# Patient Record
Sex: Female | Born: 1935 | Race: White | Smoking: Never smoker
Health system: Southern US, Community
[De-identification: ages and names within clinical notes are randomized; demographics above are authoritative.]

## PROBLEM LIST (undated history)

## (undated) DIAGNOSIS — Z8669 Personal history of other diseases of the nervous system and sense organs: Secondary | ICD-10-CM

## (undated) DIAGNOSIS — J449 Chronic obstructive pulmonary disease, unspecified: Secondary | ICD-10-CM

## (undated) DIAGNOSIS — I351 Nonrheumatic aortic (valve) insufficiency: Secondary | ICD-10-CM

## (undated) DIAGNOSIS — I340 Nonrheumatic mitral (valve) insufficiency: Secondary | ICD-10-CM

## (undated) DIAGNOSIS — J309 Allergic rhinitis, unspecified: Secondary | ICD-10-CM

## (undated) DIAGNOSIS — I071 Rheumatic tricuspid insufficiency: Secondary | ICD-10-CM

## (undated) DIAGNOSIS — I471 Supraventricular tachycardia: Secondary | ICD-10-CM

## (undated) DIAGNOSIS — I739 Peripheral vascular disease, unspecified: Secondary | ICD-10-CM

## (undated) DIAGNOSIS — I4719 Other supraventricular tachycardia: Secondary | ICD-10-CM

## (undated) DIAGNOSIS — I493 Ventricular premature depolarization: Secondary | ICD-10-CM

## (undated) DIAGNOSIS — I272 Pulmonary hypertension, unspecified: Secondary | ICD-10-CM

## (undated) DIAGNOSIS — H3530 Unspecified macular degeneration: Secondary | ICD-10-CM

## (undated) DIAGNOSIS — I779 Disorder of arteries and arterioles, unspecified: Secondary | ICD-10-CM

## (undated) DIAGNOSIS — I491 Atrial premature depolarization: Secondary | ICD-10-CM

## (undated) DIAGNOSIS — J9610 Chronic respiratory failure, unspecified whether with hypoxia or hypercapnia: Secondary | ICD-10-CM

## (undated) DIAGNOSIS — R002 Palpitations: Secondary | ICD-10-CM

## (undated) HISTORY — PX: APPENDECTOMY: SHX54

## (undated) HISTORY — DX: Chronic obstructive pulmonary disease, unspecified: J44.9

## (undated) HISTORY — DX: Unspecified macular degeneration: H35.30

## (undated) HISTORY — DX: Allergic rhinitis, unspecified: J30.9

## (undated) HISTORY — PX: CATARACT EXTRACTION: SUR2

## (undated) HISTORY — DX: Personal history of other diseases of the nervous system and sense organs: Z86.69

## (undated) HISTORY — DX: Peripheral vascular disease, unspecified: I73.9

## (undated) HISTORY — DX: Disorder of arteries and arterioles, unspecified: I77.9

## (undated) HISTORY — DX: Palpitations: R00.2

---

## 2010-11-13 ENCOUNTER — Ambulatory Visit: Payer: Self-pay | Admitting: Cardiovascular Disease

## 2010-11-13 ENCOUNTER — Encounter: Payer: Self-pay | Admitting: Cardiovascular Disease

## 2010-11-13 ENCOUNTER — Ambulatory Visit (INDEPENDENT_AMBULATORY_CARE_PROVIDER_SITE_OTHER): Payer: Medicare Other | Admitting: Cardiovascular Disease

## 2010-11-13 DIAGNOSIS — I272 Pulmonary hypertension, unspecified: Secondary | ICD-10-CM

## 2010-11-13 DIAGNOSIS — R079 Chest pain, unspecified: Secondary | ICD-10-CM | POA: Insufficient documentation

## 2010-11-13 DIAGNOSIS — I2789 Other specified pulmonary heart diseases: Secondary | ICD-10-CM

## 2010-11-13 DIAGNOSIS — I38 Endocarditis, valve unspecified: Secondary | ICD-10-CM

## 2010-11-13 DIAGNOSIS — R0609 Other forms of dyspnea: Secondary | ICD-10-CM

## 2010-11-13 NOTE — Progress Notes (Signed)
HPI: Patient is an 77 year old female, with no prior history of heart disease, now referred, from Dr. Sherril Croon, for cardiac evaluation of CP and DOE.  Patient has never undergone prior formal cardiac evaluation, or diagnostic testing. She had a recent transthoracic echocardiogram, per Dr. Sherril Croon, indicating normal LVF (EF 55-60%), with mild MR, mild/moderate AR, moderate TR, and mild/moderate pulmonary hypertension (RVSP 40-50 mmHg).  Patient presents with cardiac risk factors notable only for age and family history. She denies history of HTN, DM, or HLD. She has never smoked tobacco.  She presents with complaint of recent episodic DOE, only when walking uphill, and which she attributed to warmer temperatures. She denies any associated exertional CP, but did have a few isolated episodes of brief CP, when laying in bed at night. These symptoms were not associated with any radiation to the jaw or upper extremities, or with any associated diaphoresis, dyspnea, or nausea. She also reports one singular episode of right jaw pain, several months ago, with no strict correlation to exertion.  With the cooling of outdoor temperatures, the patient states that her DOE has completely resolved.  A recent 12-lead electrocardiogram, per Dr. Sherril Croon, indicated NSR at 76 bpm; LAD; LVH by voltage criteria; and no definite ischemic changes.  No Known Allergies  No current outpatient prescriptions on file prior to visit.    Past Medical History  Diagnosis Date  . Other chest pain   . Shortness of breath   . Other malaise and fatigue   . Pure hypercholesterolemia   . Benign neoplasm of colon   . Allergic rhinitis, cause unspecified   . Dizziness and giddiness   . Unspecified glaucoma   . Other abnormal blood chemistry   . Unspecified vitamin D deficiency   . Palpitations     Past Surgical History  Procedure Date  . Appendectomy   . Cataract extraction     Bilateral    History   Social History  . Marital  Status: Widowed    Spouse Name: N/A    Number of Children: N/A  . Years of Education: N/A   Occupational History  . RETIRED    Social History Main Topics  . Smoking status: Never Smoker   . Smokeless tobacco: Not on file  . Alcohol Use: No  . Drug Use: No  . Sexually Active: Not on file   Other Topics Concern  . Not on file   Social History Narrative  . No narrative on file    Family History  Problem Relation Age of Onset  . Heart failure Father   . Heart failure Sister   . Heart failure Mother   . Stroke Sister   . Cancer Sister   . Alzheimer's disease Brother     ROS: Negative for exertional chest pain, DOE, orthopnea, PND, lower extremity edema, palpitations, presyncope/syncope, claudication, reflux, hematuria, hematochezia, or melena. Remaining systems reviewed, and are negative.   PHYSICAL EXAM:  BP 144/78  Pulse 64  Ht 5' (1.524 m)  Wt 117 lb (53.071 kg)  BMI 22.85 kg/m2  GENERAL: 77 year old female, NAD HEENT: NCAT, PERRLA, EOMI; sclera clear; no xanthelasma NECK: palpable bilateral carotid pulses, no bruits; no JVD; no TM LUNGS: CTA bilaterally CARDIAC: RRR (S1, S2); soft, 2/6 short systolic ejection murmur at the base; no significant diastolic blow; no rubs or gallops ABDOMEN: soft, non-tender; intact BS EXTREMETIES: palpable bilateral   Femoral pulses, without bruits; intact distal pulses; no significant peripheral edema SKIN: warm/dry; no obvious rash/lesions MUSCULOSKELETAL: no  joint deformity NEURO: no focal deficit; NL affect   EKG:  Refer to EKG section    ASSESSMENT & PLAN:

## 2010-11-13 NOTE — Assessment & Plan Note (Addendum)
Mild/moderate by recent transthoracic echocardiogram, per Dr. Sherril Croon, with RVSP 40-50 mmHg. Most likely related to diastolic dysfunction.

## 2010-11-13 NOTE — Assessment & Plan Note (Signed)
Mild MR and mild/moderate aortic regurgitation, by recent transthoracic echocardiogram.

## 2010-11-13 NOTE — Assessment & Plan Note (Addendum)
Recommendation is to further assess with a dobutamine stress echocardiogram, for risk stratification. If negative, then no further workup would be indicated. However, the patient has declined to proceed. Given that her DOE symptoms have since resolved, attributed to cooler weather, she feels that she no longer needs a stress test. Therefore, we will not pursue any cardiac testing at this time, and recommend that she f/u with Dr Sherril Croon, as scheduled. She is to return to Dr Kirke Corin, on an as needed basis.  Patient was seen and examined in conjunction with Dr. Kirke Corin.

## 2010-11-13 NOTE — Assessment & Plan Note (Addendum)
As outlined, patient currently declines to proceed with recommended cardiac testing.

## 2010-11-13 NOTE — Patient Instructions (Signed)
   Follow up as needed. Your physician recommends that you continue on your current medications as directed. Please refer to the Current Medication list given to you today. 

## 2011-02-06 DIAGNOSIS — R079 Chest pain, unspecified: Secondary | ICD-10-CM

## 2011-02-18 DIAGNOSIS — R072 Precordial pain: Secondary | ICD-10-CM

## 2011-02-19 ENCOUNTER — Encounter: Payer: Self-pay | Admitting: Cardiovascular Disease

## 2011-02-23 ENCOUNTER — Encounter: Payer: Self-pay | Admitting: *Deleted

## 2011-02-26 ENCOUNTER — Encounter: Payer: Self-pay | Admitting: Cardiovascular Disease

## 2011-02-26 ENCOUNTER — Ambulatory Visit (INDEPENDENT_AMBULATORY_CARE_PROVIDER_SITE_OTHER): Payer: Medicare Other | Admitting: Cardiovascular Disease

## 2011-02-26 DIAGNOSIS — I272 Pulmonary hypertension, unspecified: Secondary | ICD-10-CM

## 2011-02-26 DIAGNOSIS — I38 Endocarditis, valve unspecified: Secondary | ICD-10-CM

## 2011-02-26 DIAGNOSIS — R079 Chest pain, unspecified: Secondary | ICD-10-CM

## 2011-02-26 DIAGNOSIS — I2789 Other specified pulmonary heart diseases: Secondary | ICD-10-CM

## 2011-02-26 NOTE — Assessment & Plan Note (Signed)
Mild/moderate by recent transthoracic echocardiogram, with RVSP 40-50 mmHg. Most likely related to diastolic dysfunction.

## 2011-02-26 NOTE — Assessment & Plan Note (Signed)
Mild MR and mild/moderate aortic regurgitation, by transthoracic echocardiogram. This can be monitored clinically with consideration for repeat echocardiogram in 2 years.

## 2011-02-26 NOTE — Assessment & Plan Note (Signed)
Patient had atypical chest pain which has resolved. Her nuclear stress test was overall normal. No further cardiac testing is indicated. The patient was reassured. She can followup with [place] as needed.

## 2011-02-26 NOTE — Patient Instructions (Signed)
   Follow up as needed. Your physician recommends that you continue on your current medications as directed. Please refer to the Current Medication list given to you today. 

## 2011-02-26 NOTE — Progress Notes (Signed)
HPI  This is an 78 year old female who is here today for a followup visit. She was seen a few months ago for atypical chest pain. She declined a stress test at that time. She presented recently to Dr. Bonnita Levan office with atypical chest pain and left arm discomfort. She was hospitalized. She ruled out for myocardial infarction. She was discharged home and scheduled for an outpatient Lexiscan nuclear stress test. This was performed and showed no evidence of ischemia or infarcts with normal ejection fraction. The patient has not had any recurrent symptoms of chest or arm discomfort. She is physically active and goes to the [facility] 2 times a week for exercise without significant limitations.  No Known Allergies   Current Outpatient Prescriptions on File Prior to Visit  Medication Sig Dispense Refill  . aspirin 81 MG tablet Take 81 mg by mouth daily.        . Calcium-Vitamin D 600-125 MG-UNIT TABS Take by mouth daily.        . Cholecalciferol (VITAMIN D3) 1000 UNITS CAPS Take by mouth daily.        . fish oil-omega-3 fatty acids 1000 MG capsule Take 1 g by mouth daily.        . timolol (BETIMOL) 0.25 % ophthalmic solution Place 1 drop into both eyes 2 (two) times daily.           Past Medical History  Diagnosis Date  . Other chest pain   . Shortness of breath   . Other malaise and fatigue   . Pure hypercholesterolemia   . Benign neoplasm of colon   . Allergic rhinitis, cause unspecified   . Dizziness and giddiness   . Unspecified glaucoma   . Other abnormal blood chemistry   . Unspecified vitamin D deficiency   . Palpitations      Past Surgical History  Procedure Date  . Appendectomy   . Cataract extraction     Bilateral     Family History  Problem Relation Age of Onset  . Heart failure Father   . Heart failure Sister   . Heart failure Mother   . Stroke Sister   . Cancer Sister   . Alzheimer's disease Brother      History   Social History  . Marital Status: Widowed   Spouse Name: N/A    Number of Children: N/A  . Years of Education: N/A   Occupational History  . RETIRED    Social History Main Topics  . Smoking status: Never Smoker   . Smokeless tobacco: Never Used  . Alcohol Use: No  . Drug Use: No  . Sexually Active: Not on file   Other Topics Concern  . Not on file   Social History Narrative  . No narrative on file     PHYSICAL EXAM   BP 158/87  Pulse 73  Ht 5' (1.524 m)  Wt 114 lb (51.71 kg)  BMI 22.26 kg/m2  Constitutional: She is oriented to person, place, and time. She appears well-developed and well-nourished. No distress.  HENT: No nasal discharge.  Head: Normocephalic and atraumatic.  Eyes: Pupils are equal, round, and reactive to light. Right eye exhibits no discharge. Left eye exhibits no discharge.  Neck: Normal range of motion. Neck supple. No JVD present. No thyromegaly present.  Cardiovascular: Normal rate, regular rhythm, normal heart sounds. Exam reveals no gallop and no friction rub.  There is a 3/6 systolic ejection murmur at the aortic area which is early peaking. Pulmonary/Chest:  Effort normal and breath sounds normal. No stridor. No respiratory distress. She has no wheezes. She has no rales. She exhibits no tenderness.  Abdominal: Soft. Bowel sounds are normal. She exhibits no distension. There is no tenderness. There is no rebound and no guarding.  Musculoskeletal: Normal range of motion. She exhibits no edema and no tenderness.  Neurological: She is alert and oriented to person, place, and time. Coordination normal.  Skin: Skin is warm and dry. No rash noted. She is not diaphoretic. No erythema. No pallor.  Psychiatric: She has a normal mood and affect. Her behavior is normal. Judgment and thought content normal.      ASSESSMENT AND PLAN

## 2014-06-02 ENCOUNTER — Emergency Department (HOSPITAL_COMMUNITY): Payer: Medicare Other

## 2014-06-02 ENCOUNTER — Encounter (HOSPITAL_COMMUNITY): Payer: Self-pay

## 2014-06-02 ENCOUNTER — Emergency Department (HOSPITAL_COMMUNITY)
Admission: EM | Admit: 2014-06-02 | Discharge: 2014-06-02 | Disposition: A | Discharge status: Home / Self Care | Payer: Medicare Other | Attending: Emergency Medicine | Admitting: Emergency Medicine

## 2014-06-02 DIAGNOSIS — Z7982 Long term (current) use of aspirin: Secondary | ICD-10-CM | POA: Diagnosis not present

## 2014-06-02 DIAGNOSIS — Z8709 Personal history of other diseases of the respiratory system: Secondary | ICD-10-CM | POA: Insufficient documentation

## 2014-06-02 DIAGNOSIS — E559 Vitamin D deficiency, unspecified: Secondary | ICD-10-CM | POA: Diagnosis not present

## 2014-06-02 DIAGNOSIS — R0602 Shortness of breath: Secondary | ICD-10-CM | POA: Insufficient documentation

## 2014-06-02 DIAGNOSIS — R0989 Other specified symptoms and signs involving the circulatory and respiratory systems: Secondary | ICD-10-CM | POA: Insufficient documentation

## 2014-06-02 DIAGNOSIS — E780 Pure hypercholesterolemia: Secondary | ICD-10-CM | POA: Diagnosis not present

## 2014-06-02 DIAGNOSIS — R079 Chest pain, unspecified: Secondary | ICD-10-CM | POA: Insufficient documentation

## 2014-06-02 DIAGNOSIS — H409 Unspecified glaucoma: Secondary | ICD-10-CM | POA: Diagnosis not present

## 2014-06-02 DIAGNOSIS — Z86018 Personal history of other benign neoplasm: Secondary | ICD-10-CM | POA: Insufficient documentation

## 2014-06-02 DIAGNOSIS — K5900 Constipation, unspecified: Secondary | ICD-10-CM | POA: Insufficient documentation

## 2014-06-02 DIAGNOSIS — Z79899 Other long term (current) drug therapy: Secondary | ICD-10-CM | POA: Insufficient documentation

## 2014-06-02 DIAGNOSIS — M545 Low back pain: Secondary | ICD-10-CM | POA: Insufficient documentation

## 2014-06-02 LAB — CBC WITH DIFFERENTIAL/PLATELET
BASOS ABS: 0.1 10*3/uL (ref 0.0–0.1)
BASOS PCT: 1 % (ref 0–1)
Eosinophils Absolute: 0.6 10*3/uL (ref 0.0–0.7)
Eosinophils Relative: 7 % — ABNORMAL HIGH (ref 0–5)
HEMATOCRIT: 42.8 % (ref 36.0–46.0)
Hemoglobin: 13.6 g/dL (ref 12.0–15.0)
Lymphocytes Relative: 22 % (ref 12–46)
Lymphs Abs: 1.8 10*3/uL (ref 0.7–4.0)
MCH: 29.3 pg (ref 26.0–34.0)
MCHC: 31.8 g/dL (ref 30.0–36.0)
MCV: 92.2 fL (ref 78.0–100.0)
MONO ABS: 0.7 10*3/uL (ref 0.1–1.0)
MONOS PCT: 9 % (ref 3–12)
Neutro Abs: 4.8 10*3/uL (ref 1.7–7.7)
Neutrophils Relative %: 61 % (ref 43–77)
Platelets: 241 10*3/uL (ref 150–400)
RBC: 4.64 MIL/uL (ref 3.87–5.11)
RDW: 13 % (ref 11.5–15.5)
WBC: 8 10*3/uL (ref 4.0–10.5)

## 2014-06-02 LAB — URINALYSIS, ROUTINE W REFLEX MICROSCOPIC
Bilirubin Urine: NEGATIVE
Glucose, UA: NEGATIVE mg/dL
HGB URINE DIPSTICK: NEGATIVE
Ketones, ur: NEGATIVE mg/dL
Nitrite: NEGATIVE
PH: 5.5 (ref 5.0–8.0)
Protein, ur: NEGATIVE mg/dL
Specific Gravity, Urine: 1.02 (ref 1.005–1.030)
UROBILINOGEN UA: 0.2 mg/dL (ref 0.0–1.0)

## 2014-06-02 LAB — URINE MICROSCOPIC-ADD ON

## 2014-06-02 LAB — COMPREHENSIVE METABOLIC PANEL
ALK PHOS: 49 U/L (ref 39–117)
ALT: 12 U/L (ref 0–35)
AST: 19 U/L (ref 0–37)
Albumin: 3.9 g/dL (ref 3.5–5.2)
Anion gap: 8 (ref 5–15)
BILIRUBIN TOTAL: 0.8 mg/dL (ref 0.3–1.2)
BUN: 13 mg/dL (ref 6–23)
CO2: 32 mmol/L (ref 19–32)
Calcium: 9.2 mg/dL (ref 8.4–10.5)
Chloride: 102 mmol/L (ref 96–112)
Creatinine, Ser: 0.45 mg/dL — ABNORMAL LOW (ref 0.50–1.10)
GFR calc Af Amer: >90 mL/min (ref >90)
GFR calc non Af Amer: 88 mL/min — ABNORMAL LOW (ref >90)
Glucose, Bld: 133 mg/dL — ABNORMAL HIGH (ref 70–99)
POTASSIUM: 3.8 mmol/L (ref 3.5–5.1)
Sodium: 142 mmol/L (ref 135–145)
Total Protein: 7.3 g/dL (ref 6.0–8.3)

## 2014-06-02 LAB — TROPONIN I

## 2014-06-02 LAB — BRAIN NATRIURETIC PEPTIDE: B Natriuretic Peptide: 93 pg/mL (ref 0.0–100.0)

## 2014-06-02 MED ORDER — POLYETHYLENE GLYCOL 3350 17 G PO PACK: 17.0000 g | PACK | Freq: Every day | ORAL | Status: DC

## 2014-06-02 MED ORDER — DICYCLOMINE HCL 20 MG PO TABS
20.0000 mg | ORAL_TABLET | Freq: Two times a day (BID) | ORAL | Status: DC | PRN
Start: 2014-06-02 — End: 2015-06-06

## 2014-06-02 NOTE — ED Provider Notes (Signed)
CSN: [id number]     Arrival date & time 06/02/14  0321 History   First MD Initiated Contact with Patient 06/02/14 0327     Chief Complaint  Patient presents with  . Back Pain     (Consider location/radiation/quality/duration/timing/severity/associated sxs/prior Treatment) HPI Patient presents with several complaints. For the past 2 nights she's had chest discomfort when lying down. She states she's had no chest discomfort this evening. In fact currently she is asymptomatic. What prompted her visit tonight was low back pain that radiated into the abdomen. This was worse when lying flat and better when she stood up. Currently she has no pain. She denies any urinary symptoms. Normal bowel movement was yesterday. No focal weakness or numbness. Patient has chronic shortness of breath and is on oxygen at night. Past Medical History  Diagnosis Date  . Other chest pain   . Shortness of breath   . Other malaise and fatigue   . Pure hypercholesterolemia   . Benign neoplasm of colon   . Allergic rhinitis, cause unspecified   . Dizziness and giddiness   . Unspecified glaucoma   . Other abnormal blood chemistry   . Unspecified vitamin D deficiency   . Palpitations    Past Surgical History  Procedure Laterality Date  . Appendectomy    . Cataract extraction      Bilateral   Family History  Problem Relation Age of Onset  . Heart failure Father   . Heart failure Sister   . Heart failure Mother   . Stroke Sister   . Cancer Sister   . Alzheimer's disease Brother    History  Substance Use Topics  . Smoking status: Never Smoker   . Smokeless tobacco: Never Used  . Alcohol Use: No   OB History    No data available     Review of Systems  Constitutional: Negative for fever and chills.  Respiratory: Positive for shortness of breath. Negative for cough.   Cardiovascular: Positive for chest pain. Negative for palpitations and leg swelling.  Gastrointestinal: Positive for abdominal pain.  Negative for nausea, vomiting, diarrhea and constipation.  Genitourinary: Negative for dysuria, frequency, hematuria, flank pain and difficulty urinating.  Musculoskeletal: Positive for back pain. Negative for myalgias, neck pain and neck stiffness.  Skin: Negative for rash and wound.  Neurological: Negative for dizziness, weakness, light-headedness, numbness and headaches.  All other systems reviewed and are negative.     Allergies  Review of patient's allergies indicates no known allergies.  Home Medications   Prior to Admission medications   Medication Sig Start Date End Date Taking? Authorizing Provider  aspirin 81 MG tablet Take 81 mg by mouth daily.      Historical Provider, MD  Calcium-Vitamin D 600-125 MG-UNIT TABS Take by mouth daily.      Historical Provider, MD  Cholecalciferol (VITAMIN D3) 1000 UNITS CAPS Take by mouth daily.      Historical Provider, MD  dicyclomine (BENTYL) 20 MG tablet Take 1 tablet (20 mg total) by mouth 2 (two) times daily as needed for spasms. 06/02/14   Loren Racer, MD  fish oil-omega-3 fatty acids 1000 MG capsule Take 1 g by mouth daily.      Historical Provider, MD  polyethylene glycol (MIRALAX / GLYCOLAX) packet Take 17 g by mouth daily. 06/02/14   Loren Racer, MD  timolol (BETIMOL) 0.25 % ophthalmic solution Place 1 drop into both eyes 2 (two) times daily.      Historical Provider, MD  BP 100/75 mmHg  Pulse 80  Temp(Src) 98.2 F (36.8 C) (Oral)  Resp 22  Ht  (1.499 m)  Wt 100 lb (45.36 kg)  BMI 20.19 kg/m2  SpO2 99% Physical Exam  Constitutional: She is oriented to person, place, and time. She appears well-developed and well-nourished. No distress.  HENT:  Head: Normocephalic and atraumatic.  Mouth/Throat: Oropharynx is clear and moist.  Eyes: EOM are normal. Pupils are equal, round, and reactive to light.  Neck: Normal range of motion. Neck supple.  Cardiovascular: Normal rate and regular rhythm.   Pulmonary/Chest: Effort  normal. No respiratory distress. She has no wheezes. She has rales. She exhibits no tenderness.  Few scattered Rales  Abdominal: Soft. Bowel sounds are normal. She exhibits no distension and no mass. There is no tenderness. There is no rebound and no guarding.  Musculoskeletal: Normal range of motion. She exhibits no edema or tenderness.  No lower extremity swelling or pain. Very mild midline sacral tenderness. No CVA tenderness. Distal pulses intact.  Neurological: She is alert and oriented to person, place, and time.  5/5 motor in all extremities. Sensation is fully intact. Patient ambulated without difficulty.  Skin: Skin is warm and dry. No rash noted. No erythema.  Psychiatric: She has a normal mood and affect. Her behavior is normal.  Nursing note and vitals reviewed.   ED Course  Procedures (including critical care time) Labs Review Labs Reviewed  CBC WITH DIFFERENTIAL/PLATELET - Abnormal; Notable for the following:    Eosinophils Relative 7 (*)    All other components within normal limits  COMPREHENSIVE METABOLIC PANEL - Abnormal; Notable for the following:    Glucose, Bld 133 (*)    Creatinine, Ser 0.45 (*)    GFR calc non Af Amer 88 (*)    All other components within normal limits  URINALYSIS, ROUTINE W REFLEX MICROSCOPIC - Abnormal; Notable for the following:    Leukocytes, UA SMALL (*)    All other components within normal limits  URINE MICROSCOPIC-ADD ON - Abnormal; Notable for the following:    Squamous Epithelial / LPF FEW (*)    All other components within normal limits  BRAIN NATRIURETIC PEPTIDE  TROPONIN I    Imaging Review Dg Abd Acute W/chest  06/02/2014   CLINICAL DATA:  Acute onset of generalized chest pain and lower back pain. Initial encounter.  EXAM: DG ABDOMEN ACUTE W/ 1V CHEST  COMPARISON:  None.  FINDINGS: The lungs are hyperexpanded, with flattening of the hemidiaphragms compatible with COPD. Chronic scarring is noted at the lung apices, right greater  than left. Scarring is also noted at the lung bases. There is no evidence of pleural effusion or pneumothorax. The cardiomediastinal silhouette is mildly enlarged.  The visualized bowel gas pattern is unremarkable. Scattered stool and air are seen within the colon; there is no evidence of small bowel dilatation to suggest obstruction. A relatively large amount of stool is noted in the colon. No free intra-abdominal air is identified on the provided upright view.  No acute osseous abnormalities are seen; the sacroiliac joints are unremarkable in appearance.  IMPRESSION: 1. Relatively large amount of stool noted in the colon, raising concern for mild constipation. 2. Findings of COPD. Chronic scarring at the lung apices and at the lung bases.   Electronically Signed   By: Roanna Raider M.D.   On: 06/02/2014 04:37     EKG Interpretation   Date/Time:  Saturday June 02 2014 03:35:34 EDT Ventricular Rate:  105  PR Interval:  191 QRS Duration: 123 QT Interval:  416 QTC Calculation: 458 R Axis:   -34 Text Interpretation:  Sinus rhythm LVH with IVCD and secondary repol abnrm  Baseline wander in lead(s) V3 Confirmed by Ranae PalmsYELVERTON  MD, Roth Ress ([id number]) on  06/02/2014 3:41:59 AM      MDM   Final diagnoses:  Chest pain  Constipation, unspecified constipation type    Patient continues to be asymptomatic. X-ray demonstrates large amount of stool throughout the colon. This may explain the patient's back and abdominal pain. Patient has a normal troponin and EKG without evidence of ischemia. She is advised to follow-up with her primary physician regarding her episodic chest pain. She understands the need to return immediately for worsening pain or concerns.    Loren Raceravid Kevonna Nolte, MD 06/02/14 [phone]

## 2014-06-02 NOTE — ED Notes (Signed)
Patient and family verbalize understanding of discharge instructions, prescription medications, home care, and follow up care. Patient out of department at this time with family.

## 2014-06-02 NOTE — ED Notes (Signed)
Patient ambulatory to restroom without deficit  

## 2014-06-02 NOTE — Discharge Instructions (Signed)
Chest Pain (Nonspecific) °It is often hard to give a specific diagnosis for the cause of chest pain. There is always a chance that your pain could be related to something serious, such as a heart attack or a blood clot in the lungs. You need to follow up with your health care provider for further evaluation. °CAUSES  °· Heartburn. °· Pneumonia or bronchitis. °· Anxiety or stress. °· Inflammation around your heart (pericarditis) or lung (pleuritis or pleurisy). °· A blood clot in the lung. °· A collapsed lung (pneumothorax). It can develop suddenly on its own (spontaneous pneumothorax) or from trauma to the chest. °· Shingles infection (herpes zoster virus). °The chest wall is composed of bones, muscles, and cartilage. Any of these can be the source of the pain. °· The bones can be bruised by injury. °· The muscles or cartilage can be strained by coughing or overwork. °· The cartilage can be affected by inflammation and become sore (costochondritis). °DIAGNOSIS  °Lab tests or other studies may be needed to find the cause of your pain. Your health care provider may have you take a test called an ambulatory electrocardiogram (ECG). An ECG records your heartbeat patterns over a 24-hour period. You may also have other tests, such as: °· Transthoracic echocardiogram (TTE). During echocardiography, sound waves are used to evaluate how blood flows through your heart. °· Transesophageal echocardiogram (TEE). °· Cardiac monitoring. This allows your health care provider to monitor your heart rate and rhythm in real time. °· Holter monitor. This is a portable device that records your heartbeat and can help diagnose heart arrhythmias. It allows your health care provider to track your heart activity for several days, if needed. °· Stress tests by exercise or by giving medicine that makes the heart beat faster. °TREATMENT  °· Treatment depends on what may be causing your chest pain. Treatment may include: °· Acid blockers for  heartburn. °· Anti-inflammatory medicine. °· Pain medicine for inflammatory conditions. °· Antibiotics if an infection is present. °· You may be advised to change lifestyle habits. This includes stopping smoking and avoiding alcohol, caffeine, and chocolate. °· You may be advised to keep your head raised (elevated) when sleeping. This reduces the chance of acid going backward from your stomach into your esophagus. °Most of the time, nonspecific chest pain will improve within 2-3 days with rest and mild pain medicine.  °HOME CARE INSTRUCTIONS  °· If antibiotics were prescribed, take them as directed. Finish them even if you start to feel better. °· For the next few days, avoid physical activities that bring on chest pain. Continue physical activities as directed. °· Do not use any tobacco products, including cigarettes, chewing tobacco, or electronic cigarettes. °· Avoid drinking alcohol. °· Only take medicine as directed by your health care provider. °· Follow your health care provider's suggestions for further testing if your chest pain does not go away. °· Keep any follow-up appointments you made. If you do not go to an appointment, you could develop lasting (chronic) problems with pain. If there is any problem keeping an appointment, call to reschedule. °SEEK MEDICAL CARE IF:  °· Your chest pain does not go away, even after treatment. °· You have a rash with blisters on your chest. °· You have a fever. °SEEK IMMEDIATE MEDICAL CARE IF:  °· You have increased chest pain or pain that spreads to your arm, neck, jaw, back, or abdomen. °· You have shortness of breath. °· You have an increasing cough, or you cough   up blood. °· You have severe back or abdominal pain. °· You feel nauseous or vomit. °· You have severe weakness. °· You faint. °· You have chills. °This is an emergency. Do not wait to see if the pain will go away. Get medical help at once. Call your local emergency services (911 in [place] Do not drive  yourself to the hospital. °MAKE SURE YOU:  °· Understand these instructions. °· Will watch your condition. °· Will get help right away if you are not doing well or get worse. °Document Released: 11/05/2004 Document Revised: 01/31/2013 Document Reviewed: 09/01/2007 °ExitCare® Patient Information ©2015 [employer]. This information is not intended to replace advice given to you by your health care provider. Make sure you discuss any questions you have with your health care provider. ° ° °Constipation °Constipation is when a person has fewer than three bowel movements a week, has difficulty having a bowel movement, or has stools that are dry, hard, or larger than normal. As people grow older, constipation is more common. If you try to fix constipation with medicines that make you have a bowel movement (laxatives), the problem may get worse. Long-term laxative use may cause the muscles of the colon to become weak. A low-fiber diet, not taking in enough fluids, and taking certain medicines may make constipation worse.  °CAUSES  °· Certain medicines, such as antidepressants, pain medicine, iron supplements, antacids, and water pills.   °· Certain diseases, such as diabetes, irritable bowel syndrome (IBS), thyroid disease, or depression.   °· Not drinking enough water.   °· Not eating enough fiber-rich foods.   °· Stress or travel.   °· Lack of physical activity or exercise.   °· Ignoring the urge to have a bowel movement.   °· Using laxatives too much.   °SIGNS AND SYMPTOMS  °· Having fewer than three bowel movements a week.   °· Straining to have a bowel movement.   °· Having stools that are hard, dry, or larger than normal.   °· Feeling full or bloated.   °· Pain in the lower abdomen.   °· Not feeling relief after having a bowel movement.   °DIAGNOSIS  °Your health care provider will take a medical history and perform a physical exam. Further testing may be done for severe constipation. Some tests may include: °· A  barium enema X-ray to examine your rectum, colon, and, sometimes, your small intestine.   °· A sigmoidoscopy to examine your lower colon.   °· A colonoscopy to examine your entire colon. °TREATMENT  °Treatment will depend on the severity of your constipation and what is causing it. Some dietary treatments include drinking more fluids and eating more fiber-rich foods. Lifestyle treatments may include regular exercise. If these diet and lifestyle recommendations do not help, your health care provider may recommend taking over-the-counter laxative medicines to help you have bowel movements. Prescription medicines may be prescribed if over-the-counter medicines do not work.  °HOME CARE INSTRUCTIONS  °· Eat foods that have a lot of fiber, such as fruits, vegetables, whole grains, and beans. °· Limit foods high in fat and processed sugars, such as french fries, hamburgers, cookies, candies, and soda.   °· A fiber supplement may be added to your diet if you cannot get enough fiber from foods.   °· Drink enough fluids to keep your urine clear or pale yellow.   °· Exercise regularly or as directed by your health care provider.   °· Go to the restroom when you have the urge to go. Do not hold it.   °· Only take over-the-counter or prescription medicines   as directed by your health care provider. Do not take other medicines for constipation without talking to your health care provider first.   °SEEK IMMEDIATE MEDICAL CARE IF:  °· You have bright red blood in your stool.   °· Your constipation lasts for more than 4 days or gets worse.   °· You have abdominal or rectal pain.   °· You have thin, pencil-like stools.   °· You have unexplained weight loss. °MAKE SURE YOU:  °· Understand these instructions. °· Will watch your condition. °· Will get help right away if you are not doing well or get worse. °Document Released: 10/25/2003 Document Revised: 01/31/2013 Document Reviewed: 11/07/2012 °ExitCare® Patient Information ©2015  [employer]. This information is not intended to replace advice given to you by your health care provider. Make sure you discuss any questions you have with your health care provider. ° °

## 2014-06-02 NOTE — ED Notes (Signed)
Pt states she has had some chest pain in the past couple of days, states when she spoke to her doctor was told she should come to the e.r. If the pain returned. Tonight, the patient had very bad lower back pain that was improved once she got up from the bed.  Pt denies any pain at this time.

## 2015-06-06 ENCOUNTER — Ambulatory Visit (INDEPENDENT_AMBULATORY_CARE_PROVIDER_SITE_OTHER): Payer: Medicare Other

## 2015-06-06 ENCOUNTER — Ambulatory Visit (INDEPENDENT_AMBULATORY_CARE_PROVIDER_SITE_OTHER): Payer: Medicare Other | Admitting: Cardiology

## 2015-06-06 ENCOUNTER — Encounter: Payer: Self-pay | Admitting: Cardiology

## 2015-06-06 VITALS — BP 100/70 | HR 91 | Ht 61.0 in | Wt 94.0 lb

## 2015-06-06 DIAGNOSIS — J449 Chronic obstructive pulmonary disease, unspecified: Secondary | ICD-10-CM | POA: Diagnosis not present

## 2015-06-06 DIAGNOSIS — R002 Palpitations: Secondary | ICD-10-CM

## 2015-06-06 DIAGNOSIS — R0602 Shortness of breath: Secondary | ICD-10-CM | POA: Diagnosis not present

## 2015-06-06 DIAGNOSIS — I38 Endocarditis, valve unspecified: Secondary | ICD-10-CM

## 2015-06-06 DIAGNOSIS — E785 Hyperlipidemia, unspecified: Secondary | ICD-10-CM

## 2015-06-06 NOTE — Progress Notes (Signed)
Cardiology Office Note  Date: 06/06/2015   ID: Olivia ParrKathleen Qualls, DOB 02-Apr-1925, MRN [id number]  PCP: Ignatius Speckinghruv B Vyas, MD  Referring provider: Dr. Kari BaarsEdward Hawkins Consulting Cardiologist: Nona DellSamuel Calil Amor, MD   Chief Complaint  Patient presents with  . Chest Pain    History of Present Illness: Olivia Mccormick is an 82 y.o. female referred for cardiology consultation by Dr. Juanetta GoslingHawkins. She presents describing an episode that occurred about 2 weeks ago. She states that she had done her morning.devotional, was walking into the kitchen, and suddenly felt "something wrong." She describes a brief sense of fast heartbeat, did not get overly dizzy or pass out however. She had no chest pain at that time.  I reviewed her previous records. She was seen by Dr. Kirke CorinArida back in 2013 for evaluation of atypical chest pain. She underwent a Lexiscan Cardiolite at that time that was low risk without active evidence of ischemia. She has no clear history of cardiac arrhythmia. Echocardiography from 5 years ago demonstrated all to moderate aortic regurgitation and also moderate tricuspid regurgitation with moderately elevated pulmonary pressures.  She has COPD and is on chronic oxygen. Reports chronic dyspnea on exertion at NYHA class 2-3.  I reviewed her ECG today which shows sinus rhythm with left anterior fascicular block, LVH, and repolarization abnormalities.  Past Medical History  Diagnosis Date  . Allergic rhinitis   . History of glaucoma   . Palpitations   . Hyperlipidemia   . COPD (chronic obstructive pulmonary disease) (HCC)   . Macular degeneration   . Carotid artery disease (HCC)     1-50% bilateral ICA stenosis 2011 [facility]    Past Surgical History  Procedure Laterality Date  . Appendectomy    . Cataract extraction Bilateral     Current Outpatient Prescriptions  Medication Sig Dispense Refill  . ALPRAZolam (XANAX) 0.25 MG tablet Take 0.25 mg by mouth at bedtime as needed  for anxiety.    Marland Kitchen. aspirin 81 MG tablet Take 81 mg by mouth daily.      . Budesonide-Formoterol Fumarate (SYMBICORT IN) Inhale into the lungs.    . Calcium-Vitamin D 600-125 MG-UNIT TABS Take by mouth daily.      . Cholecalciferol (VITAMIN D3) 1000 UNITS CAPS Take by mouth daily.      Marland Kitchen. DOXYCYCLINE CALCIUM PO Take 100 mg by mouth.    . fish oil-omega-3 fatty acids 1000 MG capsule Take 1 g by mouth daily.      . polyethylene glycol (MIRALAX / GLYCOLAX) packet Take 17 g by mouth daily. 14 each 0  . timolol (BETIMOL) 0.25 % ophthalmic solution Place 1 drop into both eyes 2 (two) times daily.       No current facility-administered medications for this visit.   Allergies:  Prednisone   Social History: The patient  reports that she has never smoked. She has never used smokeless tobacco. She reports that she does not drink alcohol or use illicit drugs.   Family History: The patient's family history includes Alzheimer's disease in her brother; Cancer in her sister; Heart failure in her father, mother, and sister; Stroke in her sister.   ROS:  Please see the history of present illness. Otherwise, complete review of systems is positive for decreased hearing, occasional stomach cramps.  All other systems are reviewed and negative.   Physical Exam: VS:  BP 100/70 mmHg  Pulse 91  Ht 5\' 1"  (1.549 m)  Wt 94 lb (42.638 kg)  BMI 17.77  kg/m2  SpO2 90%, BMI Body mass index is 17.77 kg/(m^2).  Wt Readings from Last 3 Encounters:  06/06/15 94 lb (42.638 kg)  06/02/14 100 lb (45.36 kg)  02/26/11 114 lb (51.71 kg)    General: Elderly woman, appears comfortable at rest. Wearing oxygen via nasal cannula. HEENT: Conjunctiva and lids normal, oropharynx clear. Neck: Supple, no elevated JVP or carotid bruits, no thyromegaly. Lungs: Diminished breath sounds without wheezing, nonlabored breathing at rest. Cardiac: Regular rate and rhythm, no S3, 2/6 systolic murmur mainly LSB and at apex, no pericardial  rub. Abdomen: Soft, nontender, bowel sounds present, no guarding or rebound. Extremities: No pitting edema, distal pulses 2+. Skin: Warm and dry. Musculoskeletal: Kyphosis noted. Neuropsychiatric: Alert and oriented x3, affect grossly appropriate.  ECG: I personally reviewed the prior tracing from 06/02/2014 which showed sinus rhythm with increased voltage and leftward axis.  Recent Labwork:  April 2016: Hemoglobin 13.6, platelets 241, potassium 3.8, BUN 13, creatinine 0.45, AST 19, ALT 12  Other Studies Reviewed Today:  Lexiscan Cardiolite 02/18/2011 [facility]): No diagnostic ST segment changes. No evidence of scar or ischemia with hyperdynamic LVEF.  Acute abdominal series 06/02/2014: FINDINGS: The lungs are hyperexpanded, with flattening of the hemidiaphragms compatible with COPD. Chronic scarring is noted at the lung apices, right greater than left. Scarring is also noted at the lung bases. There is no evidence of pleural effusion or pneumothorax. The cardiomediastinal silhouette is mildly enlarged.  The visualized bowel gas pattern is unremarkable. Scattered stool and air are seen within the colon; there is no evidence of small bowel dilatation to suggest obstruction. A relatively large amount of stool is noted in the colon. No free intra-abdominal air is identified on the provided upright view.  No acute osseous abnormalities are seen; the sacroiliac joints are unremarkable in appearance.  IMPRESSION: 1. Relatively large amount of stool noted in the colon, raising concern for mild constipation. 2. Findings of COPD. Chronic scarring at the lung apices and at the lung bases.  Echocardiogram 10/27/2010 [facility]): LVEF 55-65%, mild left atrial enlargement, MAC with mildly thickened mitral leaflets and mild mitral regurgitation, chordal SAM noted, moderately sclerotic aortic valve with mild to moderate aortic regurgitation, moderate tricuspid regurgitation with  RVSP 40-50 mmHg.  Assessment and Plan:  1. Brief episode of apparent palpitations as outlined above. She has had no obvious recurrence. No history of cardiac arrhythmia, ECG reviewed today as outlined above. She has a history of valvular heart disease assessed approximately 5 years ago. Also COPD requiring oxygen with chronic shortness of breath. We discussed the possibility of updating her echocardiogram and obtaining an outpatient cardiac monitor for further investigation. She agreed with a brief period of monitoring, and also an echocardiogram. This will at least give [place] an idea about whether any additional medications might be warranted and for general prognostic purposes.  2. Oxygen requiring COPD. Followed by Dr. Juanetta Gosling.  3. History of hyperlipidemia, on omega-3 supplements. She follows with Dr. Sherril Croon.  Current medicines were reviewed with the patient today.   Orders Placed This Encounter  Procedures  . Holter monitor - 48 hour  . EKG 12-Lead  . ECHOCARDIOGRAM COMPLETE    Disposition: Call with results.  Signed, Jonelle Sidle, MD, [facility] 06/06/2015 9:31 AM    [facility] [address] [place], [place], [place] [address] Phone: [phone]; Fax: [phone]

## 2015-06-06 NOTE — Patient Instructions (Signed)
Your physician recommends that you continue on your current medications as directed. Please refer to the Current Medication list given to you today. Your physician has requested that you have an echocardiogram. Echocardiography is a painless test that uses sound waves to create images of your heart. It provides your doctor with information about the size and shape of your heart and how well your heart's chambers and valves are working. This procedure takes approximately one hour. There are no restrictions for this procedure. Your physician has recommended that you wear a 48 hour holter monitor. Holter monitors are medical devices that record the heart's electrical activity. Doctors most often use these monitors to diagnose arrhythmias. Arrhythmias are problems with the speed or rhythm of the heartbeat. The monitor is a small, portable device. You can wear one while you do your normal daily activities. This is usually used to diagnose what is causing palpitations/syncope (passing out). We will call you with your results.

## 2015-06-07 DIAGNOSIS — R002 Palpitations: Secondary | ICD-10-CM

## 2015-06-12 ENCOUNTER — Other Ambulatory Visit: Payer: Self-pay

## 2015-06-12 ENCOUNTER — Ambulatory Visit (INDEPENDENT_AMBULATORY_CARE_PROVIDER_SITE_OTHER): Payer: Medicare Other

## 2015-06-12 DIAGNOSIS — R0602 Shortness of breath: Secondary | ICD-10-CM

## 2015-06-13 ENCOUNTER — Telehealth: Payer: Self-pay | Admitting: *Deleted

## 2015-06-13 NOTE — Telephone Encounter (Signed)
Patient informed. 

## 2015-06-13 NOTE — Telephone Encounter (Signed)
-----   Message from Jonelle SidleSamuel G McDowell, MD sent at 06/12/2015  2:51 PM EDT ----- Reviewed report. Normal LVEF. Mild to moderate aortic regurgitation as documented previously. Also mild to moderate mitral regurgitation and tricuspid regurgitation. Not entirely clear that this is symptom provoking. She has pulmonary hypertension which likely is due to her known COPD. None of these findings would necessarily explain the spell that she had. We will follow-up on cardiac monitoring.

## 2015-06-13 NOTE — Telephone Encounter (Signed)
-----   Message from Jonelle SidleSamuel G McDowell, MD sent at 06/13/2015  9:39 AM EDT ----- Monitor reviewed. Nothing in particular showed up to necessarily explain the symptoms that she had prompting office visit. Importantly, there were no pauses or prolonged bradycardia. Occasional ectopy noted which may well be asymptomatic. No further workup anticipated unless symptoms progress.

## 2016-03-10 IMAGING — DX DG ABDOMEN ACUTE W/ 1V CHEST
3 series · 3 of 3 positions shown · non-contrast
Comparison: None.

CLINICAL DATA: Acute onset of generalized chest pain and lower back
pain. Initial encounter.

EXAM:
DG ABDOMEN ACUTE W/ 1V CHEST

[chest pa]
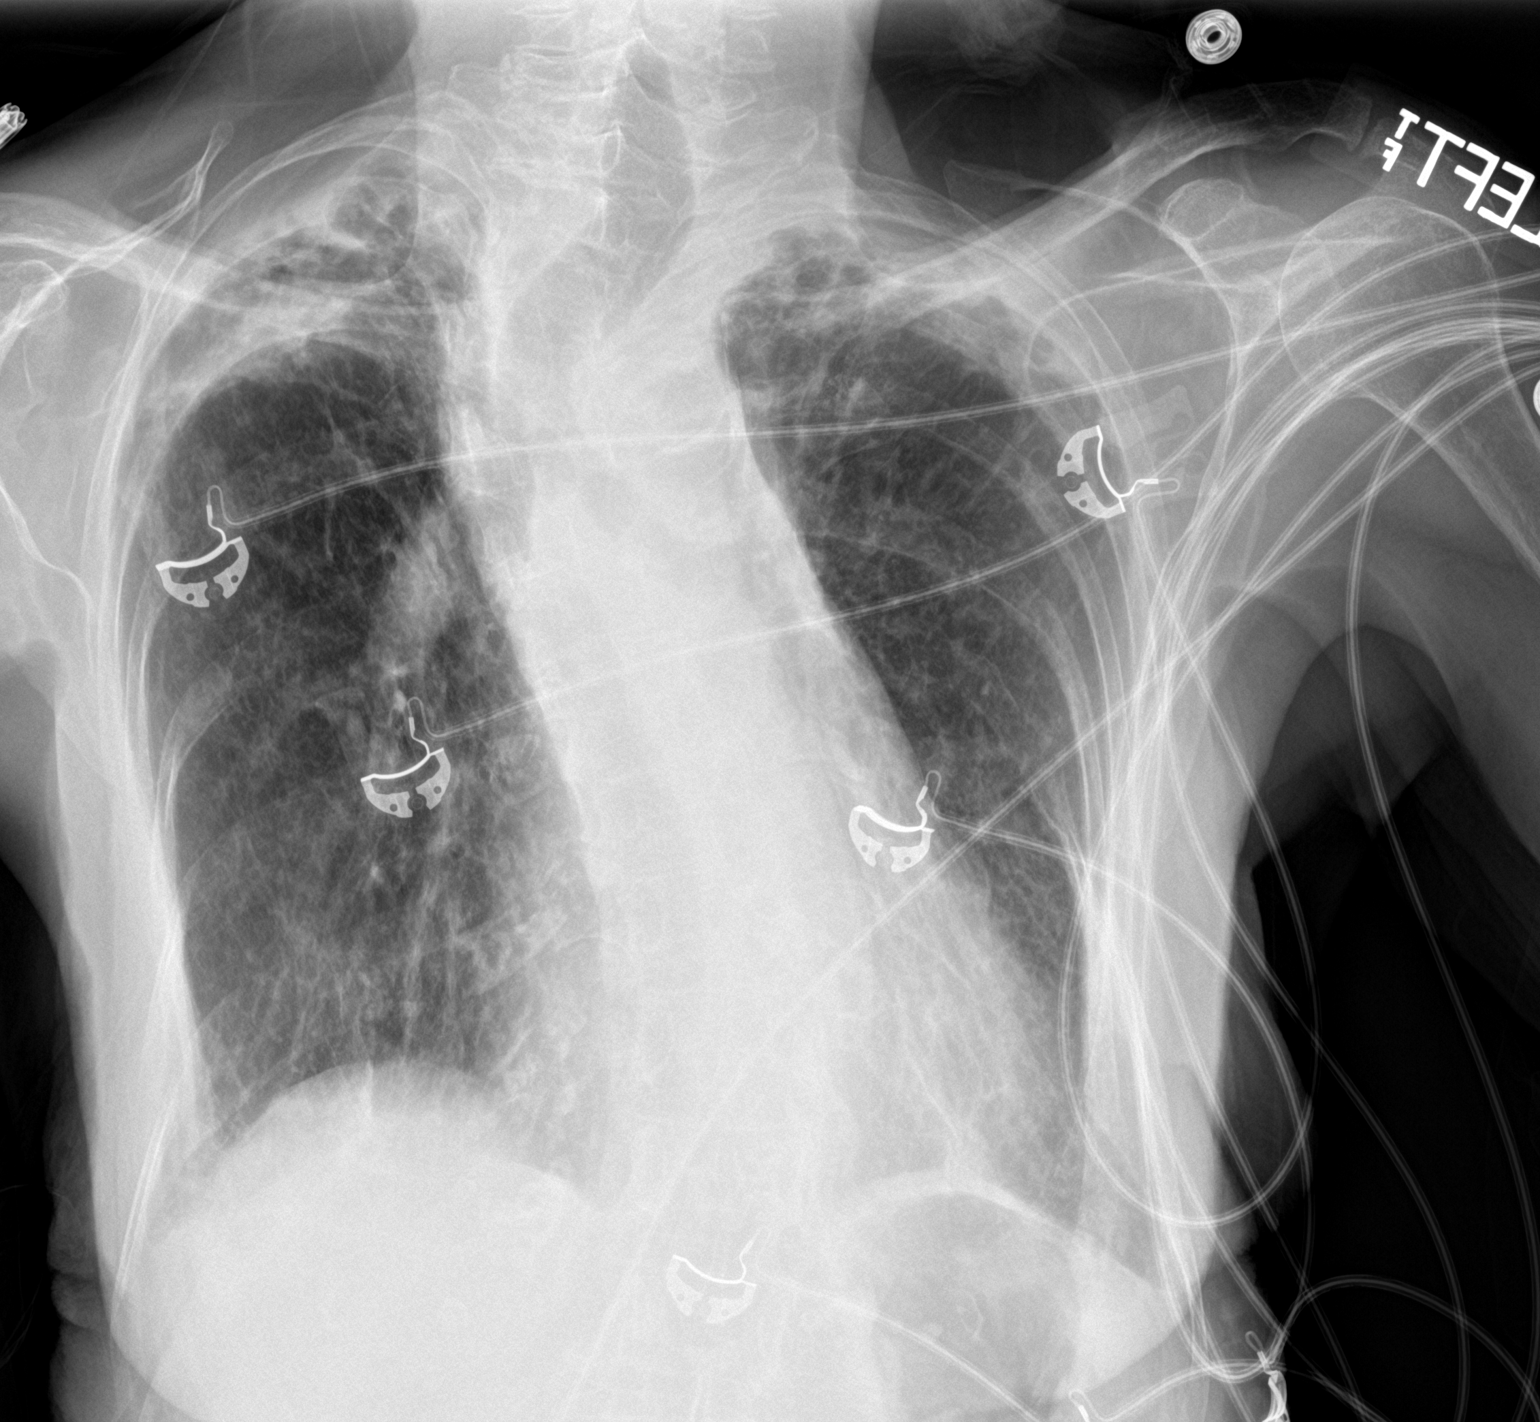

[abdomen erect]
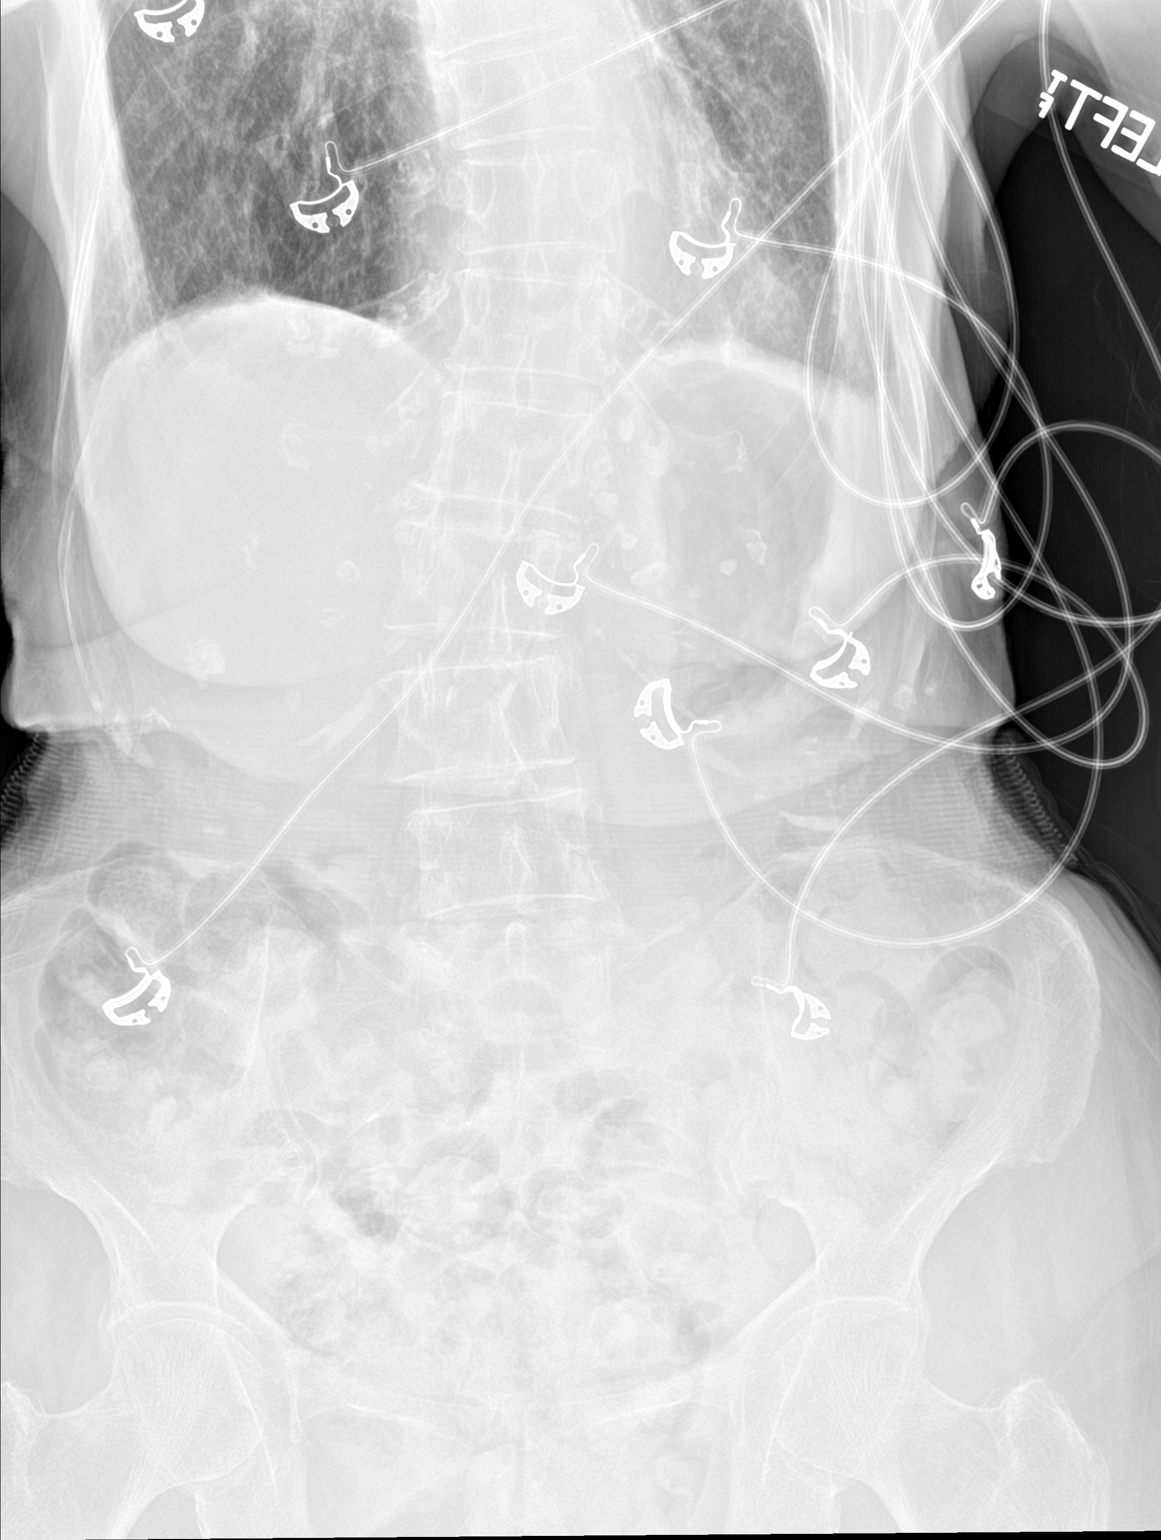

[abdomen supine]
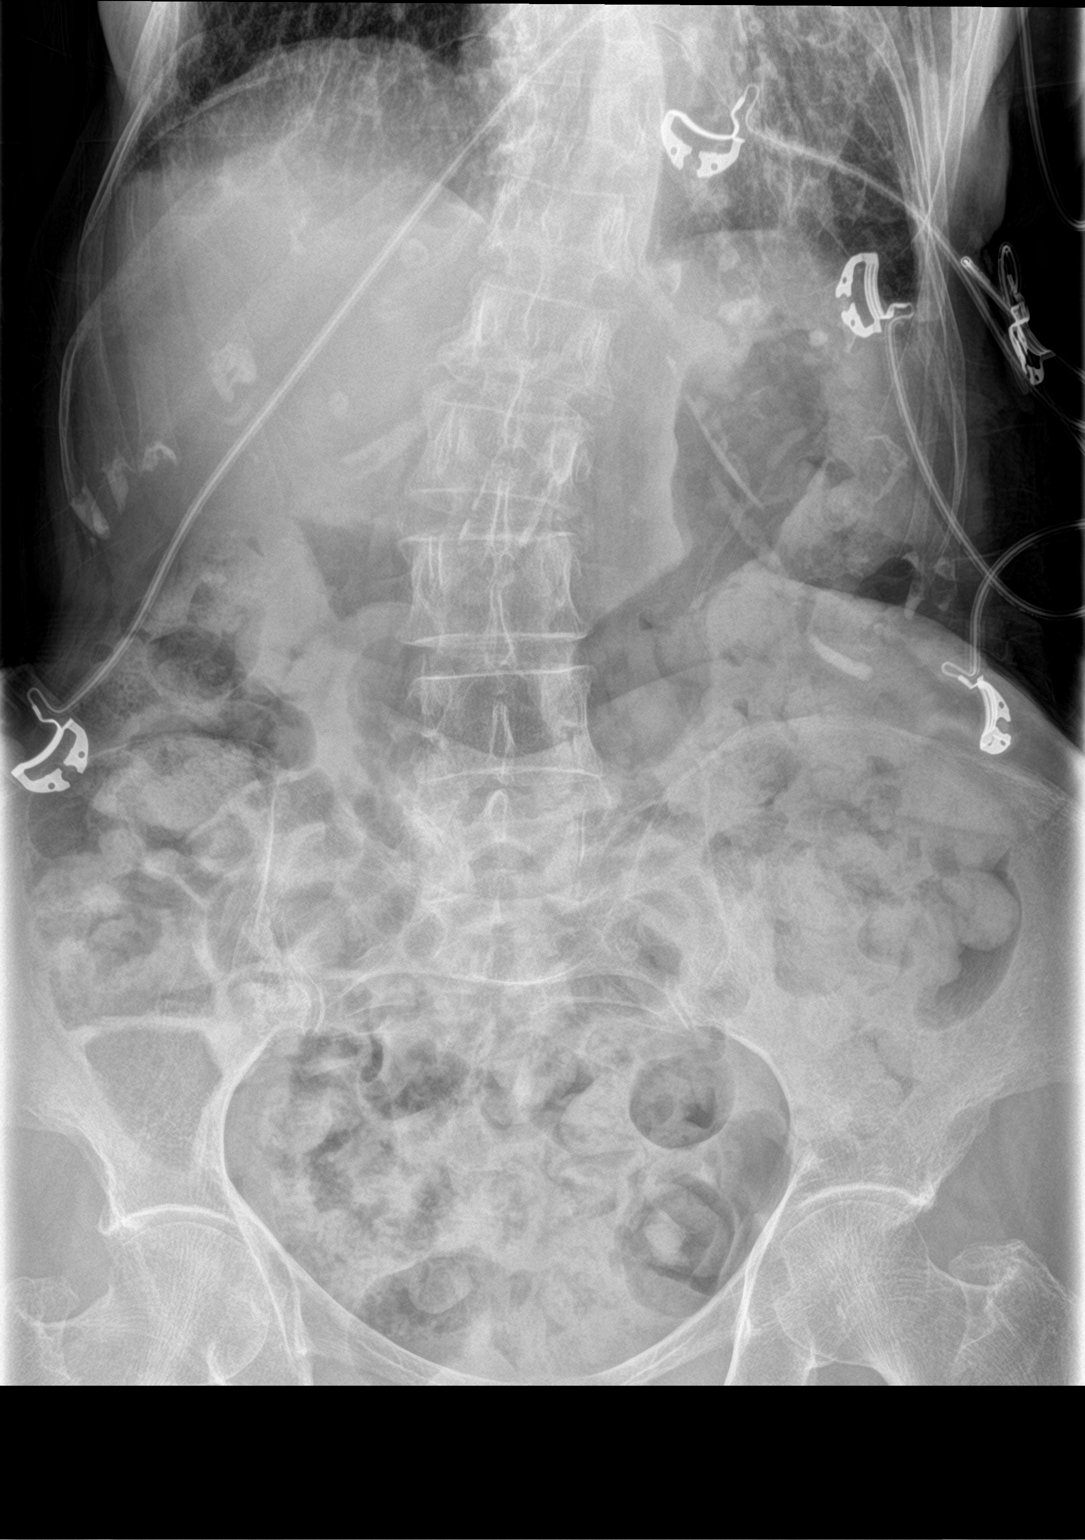

[3 of 3 positions shown; findings below may reference images not displayed]

FINDINGS: The lungs are hyperexpanded, with flattening of the hemidiaphragms
compatible with COPD. Chronic scarring is noted at the lung apices,
right greater than left. Scarring is also noted at the lung bases.
There is no evidence of pleural effusion or pneumothorax. The
cardiomediastinal silhouette is mildly enlarged.

The visualized bowel gas pattern is unremarkable. Scattered stool
and air are seen within the colon; there is no evidence of small
bowel dilatation to suggest obstruction. A relatively large amount
of stool is noted in the colon. No free intra-abdominal air is
identified on the provided upright view.

No acute osseous abnormalities are seen; the sacroiliac joints are
unremarkable in appearance.
IMPRESSION: 1. Relatively large amount of stool noted in the colon, raising
concern for mild constipation.
2. Findings of COPD. Chronic scarring at the lung apices and at the
lung bases.

## 2016-06-16 ENCOUNTER — Emergency Department (HOSPITAL_COMMUNITY)
Admission: EM | Admit: 2016-06-16 | Discharge: 2016-06-16 | Disposition: A | Discharge status: Home / Self Care | Payer: Medicare Other | Attending: Emergency Medicine | Admitting: Emergency Medicine

## 2016-06-16 ENCOUNTER — Encounter (HOSPITAL_COMMUNITY): Payer: Self-pay | Admitting: *Deleted

## 2016-06-16 ENCOUNTER — Emergency Department (HOSPITAL_COMMUNITY): Payer: Medicare Other

## 2016-06-16 DIAGNOSIS — J449 Chronic obstructive pulmonary disease, unspecified: Secondary | ICD-10-CM | POA: Insufficient documentation

## 2016-06-16 DIAGNOSIS — Z79899 Other long term (current) drug therapy: Secondary | ICD-10-CM | POA: Diagnosis not present

## 2016-06-16 DIAGNOSIS — R0789 Other chest pain: Secondary | ICD-10-CM | POA: Insufficient documentation

## 2016-06-16 DIAGNOSIS — Z7982 Long term (current) use of aspirin: Secondary | ICD-10-CM | POA: Diagnosis not present

## 2016-06-16 LAB — CBC
HEMATOCRIT: 44.1 % (ref 36.0–46.0)
Hemoglobin: 13.6 g/dL (ref 12.0–15.0)
MCH: 28.9 pg (ref 26.0–34.0)
MCHC: 30.8 g/dL (ref 30.0–36.0)
MCV: 93.6 fL (ref 78.0–100.0)
Platelets: 221 10*3/uL (ref 150–400)
RBC: 4.71 MIL/uL (ref 3.87–5.11)
RDW: 13.3 % (ref 11.5–15.5)
WBC: 7.8 10*3/uL (ref 4.0–10.5)

## 2016-06-16 LAB — BASIC METABOLIC PANEL
Anion gap: 6 (ref 5–15)
BUN: 11 mg/dL (ref 6–20)
CALCIUM: 9.6 mg/dL (ref 8.9–10.3)
CO2: 39 mmol/L — ABNORMAL HIGH (ref 22–32)
CREATININE: 0.38 mg/dL — AB (ref 0.44–1.00)
Chloride: 99 mmol/L — ABNORMAL LOW (ref 101–111)
GFR calc Af Amer: >60 mL/min (ref >60)
GFR calc non Af Amer: >60 mL/min (ref >60)
Glucose, Bld: 126 mg/dL — ABNORMAL HIGH (ref 65–99)
Potassium: 4.6 mmol/L (ref 3.5–5.1)
SODIUM: 144 mmol/L (ref 135–145)

## 2016-06-16 LAB — I-STAT TROPONIN, ED: Troponin i, poc: 0 ng/mL (ref 0.00–0.08)

## 2016-06-16 LAB — TROPONIN I: Troponin I: <0.03 ng/mL (ref <0.03)

## 2016-06-16 NOTE — ED Provider Notes (Signed)
[facility]-EMERGENCY DEPT Provider Note   CSN: [id number] Arrival date & time: 06/16/16  1652     History   Chief Complaint Chief Complaint  Patient presents with  . Chest Pain    HPI Olivia Mccormick is a 83 y.o. female.  Patient states that she's had a couple episodes of chest discomfort lasting about 5 minutes. She patient is no longer having pain now   The history is provided by the patient. No language interpreter was used.  Chest Pain   This is a new problem. The current episode started less than 1 hour ago. The problem occurs rarely. The problem has been resolved. The pain is present in the substernal region. The pain is at a severity of 4/10. The pain is moderate. The quality of the pain is described as brief. The pain does not radiate. Pertinent negatives include no abdominal pain, no back pain, no cough and no headaches.  Pertinent negatives for past medical history include no seizures.    Past Medical History:  Diagnosis Date  . Allergic rhinitis   . Carotid artery disease (HCC)    1-50% bilateral ICA stenosis 2011 [facility]  . COPD (chronic obstructive pulmonary disease) (HCC)   . History of glaucoma   . Macular degeneration   . Palpitations     Patient Active Problem List   Diagnosis Date Noted  . Chest pain 11/13/2010  . Dyspnea on exertion 11/13/2010  . Pulmonary hypertension (HCC) 11/13/2010  . Valvular heart disease 11/13/2010    Past Surgical History:  Procedure Laterality Date  . APPENDECTOMY    . CATARACT EXTRACTION Bilateral     OB History    No data available       Home Medications    Prior to Admission medications   Medication Sig Start Date End Date Taking? Authorizing Provider  ALPRAZolam Prudy Feeler) 0.25 MG tablet Take 0.25 mg by mouth at bedtime as needed for anxiety.    [provider]  aspirin 81 MG tablet Take 81 mg by mouth daily.      [provider]  Budesonide-Formoterol Fumarate (SYMBICORT IN)  Inhale into the lungs.    [provider]  Calcium-Vitamin D 600-125 MG-UNIT TABS Take by mouth daily.      [provider]  Cholecalciferol (VITAMIN D3) 1000 UNITS CAPS Take by mouth daily.      [provider]  DOXYCYCLINE CALCIUM PO Take 100 mg by mouth.    [provider]  fish oil-omega-3 fatty acids 1000 MG capsule Take 1 g by mouth daily.      [provider]  polyethylene glycol (MIRALAX / GLYCOLAX) packet Take 17 g by mouth daily. 06/02/14   Loren Racer, MD  timolol (BETIMOL) 0.25 % ophthalmic solution Place 1 drop into both eyes 2 (two) times daily.      [provider]    Family History Family History  Problem Relation Age of Onset  . Heart failure Father   . Heart failure Sister   . Heart failure Mother   . Stroke Sister   . Cancer Sister   . Alzheimer's disease Brother     Social History Social History  Substance Use Topics  . Smoking status: Never Smoker  . Smokeless tobacco: Never Used  . Alcohol use No     Allergies   Prednisone   Review of Systems Review of Systems  Constitutional: Negative for appetite change and fatigue.  HENT: Negative for congestion, ear discharge  and sinus pressure.   Eyes: Negative for discharge.  Respiratory: Negative for cough.   Cardiovascular: Positive for chest pain.  Gastrointestinal: Negative for abdominal pain and diarrhea.  Genitourinary: Negative for frequency and hematuria.  Musculoskeletal: Negative for back pain.  Skin: Negative for rash.  Neurological: Negative for seizures and headaches.  Psychiatric/Behavioral: Negative for hallucinations.     Physical Exam Updated Vital Signs BP (!) 161/77   Pulse 74   Temp 98.4 F (36.9 C) (Oral)   Resp (!) 21   Ht 5' (1.524 m)   Wt 97 lb (44 kg)   SpO2 100%   BMI 18.94 kg/m   Physical Exam  Constitutional: She is oriented to person, place, and time. She appears well-developed.  HENT:  Head:  Normocephalic.  Eyes: Conjunctivae and EOM are normal. No scleral icterus.  Neck: Neck supple. No thyromegaly present.  Cardiovascular: Normal rate and regular rhythm.  Exam reveals no gallop and no friction rub.   No murmur heard. Pulmonary/Chest: No stridor. She has no wheezes. She has no rales. She exhibits no tenderness.  Abdominal: She exhibits no distension. There is no tenderness. There is no rebound.  Musculoskeletal: Normal range of motion. She exhibits no edema.  Lymphadenopathy:    She has no cervical adenopathy.  Neurological: She is oriented to person, place, and time. She exhibits normal muscle tone. Coordination normal.  Skin: No rash noted. No erythema.  Psychiatric: She has a normal mood and affect. Her behavior is normal.     ED Treatments / Results  Labs (all labs ordered are listed, but only abnormal results are displayed) Labs Reviewed  BASIC METABOLIC PANEL - Abnormal; Notable for the following:       Result Value   Chloride 99 (*)    CO2 39 (*)    Glucose, Bld 126 (*)    Creatinine, Ser 0.38 (*)    All other components within normal limits  CBC  TROPONIN I    EKG  EKG Interpretation  Date/Time:  Tuesday Jun 16 2016 16:59:50 EDT Ventricular Rate:  87 PR Interval:  166 QRS Duration: 134 QT Interval:  378 QTC Calculation: 454 R Axis:   -27 Text Interpretation:  Normal sinus rhythm Right bundle branch block Left ventricular hypertrophy with repolarization abnormality Cannot rule out Anterior infarct , age undetermined Abnormal ECG Confirmed by Rebeka Kimble  MD, Birgit Nowling [phone]([id number]) on 06/16/2016 7:53:01 PM       Radiology Dg Chest 2 View  Result Date: 06/16/2016 CLINICAL DATA:  Chest pain with chronic shortness of breath EXAM: CHEST  2 VIEW COMPARISON:  June 02, 2014 FINDINGS: There is fibrosis in both lung apices, stable. There is no appreciable edema or consolidation. Heart size is within normal limits. There is distortion of pulmonary vascularity in the upper  lobes due to new cicatrization in the lung apices. This is a stable finding. There is aortic atherosclerosis. No adenopathy. There is thoracic scoliosis, convex right in the upper thoracic region and convex to the left more inferiorly. IMPRESSION: Apical fibrosis bilaterally. No edema or consolidation. Stable cardiac silhouette. Aortic atherosclerosis. No evident adenopathy. Electronically Signed   By: Bretta BangWilliam  Woodruff III M.D.   On: 06/16/2016 17:32    Procedures Procedures (including critical care time)  Medications Ordered in ED Medications - No data to display   Initial Impression / Assessment and Plan / ED Course  I have reviewed the triage vital signs and the nursing notes.  Pertinent labs & imaging results that were  available during my care of the patient were reviewed by me and considered in my medical decision making (see chart for details).     Patient's labs EKG and chest x-ray did not show any acute changes. I told the patient we should admit her to the hospital for observation and cycle enzymes. She refused to be admitted. She will follow-up with her cardiologist this week  Final Clinical Impressions(s) / ED Diagnoses   Final diagnoses:  Atypical chest pain    New Prescriptions New Prescriptions   No medications on file     Bethann Berkshire, MD 06/16/16 2102

## 2016-06-16 NOTE — ED Triage Notes (Signed)
Pt c/o intermittent mid chest pain and right shoulder pain x couple weeks. Denies SOB more than usual, pt wears O2 at home, hx of COPD. Denies nausea, vomiting, dizziness.

## 2016-06-16 NOTE — ED Notes (Signed)
Pt does not want IV inserted at this time.

## 2016-06-16 NOTE — Discharge Instructions (Signed)
Follow up with your heart md this week.  Return if problems

## 2016-06-17 ENCOUNTER — Ambulatory Visit: Payer: Medicare Other | Admitting: Cardiovascular Disease

## 2016-06-17 ENCOUNTER — Encounter (HOSPITAL_COMMUNITY): Payer: Self-pay | Admitting: Emergency Medicine

## 2016-06-17 ENCOUNTER — Observation Stay (HOSPITAL_COMMUNITY)
Admission: EM | Admit: 2016-06-17 | Discharge: 2016-06-18 | Disposition: A | Discharge status: Home / Self Care | Payer: Medicare Other | Attending: Family Medicine | Admitting: Family Medicine

## 2016-06-17 ENCOUNTER — Emergency Department (HOSPITAL_COMMUNITY): Payer: Medicare Other

## 2016-06-17 ENCOUNTER — Telehealth: Payer: Self-pay | Admitting: Cardiology

## 2016-06-17 DIAGNOSIS — Z7982 Long term (current) use of aspirin: Secondary | ICD-10-CM | POA: Diagnosis not present

## 2016-06-17 DIAGNOSIS — R079 Chest pain, unspecified: Secondary | ICD-10-CM

## 2016-06-17 DIAGNOSIS — J449 Chronic obstructive pulmonary disease, unspecified: Secondary | ICD-10-CM | POA: Diagnosis not present

## 2016-06-17 DIAGNOSIS — M549 Dorsalgia, unspecified: Secondary | ICD-10-CM | POA: Insufficient documentation

## 2016-06-17 DIAGNOSIS — R739 Hyperglycemia, unspecified: Secondary | ICD-10-CM | POA: Diagnosis not present

## 2016-06-17 DIAGNOSIS — Z79899 Other long term (current) drug therapy: Secondary | ICD-10-CM | POA: Insufficient documentation

## 2016-06-17 DIAGNOSIS — R072 Precordial pain: Principal | ICD-10-CM | POA: Insufficient documentation

## 2016-06-17 HISTORY — DX: Atrial premature depolarization: I49.1

## 2016-06-17 HISTORY — DX: Nonrheumatic mitral (valve) insufficiency: I34.0

## 2016-06-17 HISTORY — DX: Supraventricular tachycardia: I47.1

## 2016-06-17 HISTORY — DX: Chronic respiratory failure, unspecified whether with hypoxia or hypercapnia: J96.10

## 2016-06-17 HISTORY — DX: Pulmonary hypertension, unspecified: I27.20

## 2016-06-17 HISTORY — DX: Other supraventricular tachycardia: I47.19

## 2016-06-17 HISTORY — DX: Rheumatic tricuspid insufficiency: I07.1

## 2016-06-17 HISTORY — DX: Ventricular premature depolarization: I49.3

## 2016-06-17 HISTORY — DX: Nonrheumatic aortic (valve) insufficiency: I35.1

## 2016-06-17 LAB — TROPONIN I

## 2016-06-17 LAB — BASIC METABOLIC PANEL
ANION GAP: 7 (ref 5–15)
BUN: 13 mg/dL (ref 6–20)
CO2: 35 mmol/L — AB (ref 22–32)
Calcium: 9 mg/dL (ref 8.9–10.3)
Chloride: 98 mmol/L — ABNORMAL LOW (ref 101–111)
Creatinine, Ser: 0.45 mg/dL (ref 0.44–1.00)
GFR calc non Af Amer: >60 mL/min (ref >60)
Glucose, Bld: 118 mg/dL — ABNORMAL HIGH (ref 65–99)
POTASSIUM: 4.1 mmol/L (ref 3.5–5.1)
Sodium: 140 mmol/L (ref 135–145)

## 2016-06-17 LAB — CBC
HEMATOCRIT: 38.7 % (ref 36.0–46.0)
HEMOGLOBIN: 12.2 g/dL (ref 12.0–15.0)
MCH: 29 pg (ref 26.0–34.0)
MCHC: 31.5 g/dL (ref 30.0–36.0)
MCV: 91.9 fL (ref 78.0–100.0)
Platelets: 211 10*3/uL (ref 150–400)
RBC: 4.21 MIL/uL (ref 3.87–5.11)
RDW: 13.2 % (ref 11.5–15.5)
WBC: 9.5 10*3/uL (ref 4.0–10.5)

## 2016-06-17 LAB — D-DIMER, QUANTITATIVE: D-Dimer, Quant: 0.31 ug/mL-FEU (ref 0.00–0.50)

## 2016-06-17 MED ORDER — POLYETHYLENE GLYCOL 3350 17 G PO PACK
17.0000 g | PACK | Freq: Two times a day (BID) | ORAL | Status: DC
  Administered 2016-06-18: 17 g via ORAL
  Filled 2016-06-17: qty 1

## 2016-06-17 MED ORDER — MOMETASONE FURO-FORMOTEROL FUM 200-5 MCG/ACT IN AERO
INHALATION_SPRAY | RESPIRATORY_TRACT | Status: AC
  Filled 2016-06-17: qty 8.8

## 2016-06-17 MED ORDER — ONDANSETRON HCL 4 MG/2ML IJ SOLN: 4.0000 mg | Freq: Four times a day (QID) | INTRAMUSCULAR | Status: DC | PRN

## 2016-06-17 MED ORDER — ASPIRIN EC 81 MG PO TBEC
81.0000 mg | DELAYED_RELEASE_TABLET | Freq: Every day | ORAL | Status: DC
  Filled 2016-06-17: qty 1

## 2016-06-17 MED ORDER — ALPRAZOLAM 0.25 MG PO TABS
0.2500 mg | ORAL_TABLET | Freq: Two times a day (BID) | ORAL | Status: DC
  Administered 2016-06-17 – 2016-06-18 (×2): 0.25 mg via ORAL
  Filled 2016-06-17 (×2): qty 1

## 2016-06-17 MED ORDER — TIMOLOL MALEATE 0.5 % OP SOLN
1.0000 [drp] | Freq: Every day | OPHTHALMIC | Status: DC
  Administered 2016-06-18: 1 [drp] via OPHTHALMIC

## 2016-06-17 MED ORDER — ENOXAPARIN SODIUM 30 MG/0.3ML ~~LOC~~ SOLN
30.0000 mg | SUBCUTANEOUS | Status: DC
  Administered 2016-06-17: 30 mg via SUBCUTANEOUS
  Filled 2016-06-17: qty 0.3

## 2016-06-17 MED ORDER — GI COCKTAIL ~~LOC~~: 30.0000 mL | Freq: Four times a day (QID) | ORAL | Status: DC | PRN

## 2016-06-17 MED ORDER — ACETAMINOPHEN 325 MG PO TABS: 650.0000 mg | ORAL_TABLET | ORAL | Status: DC | PRN

## 2016-06-17 MED ORDER — MOMETASONE FURO-FORMOTEROL FUM 200-5 MCG/ACT IN AERO
2.0000 | INHALATION_SPRAY | Freq: Two times a day (BID) | RESPIRATORY_TRACT | Status: DC
  Administered 2016-06-18: 2 via RESPIRATORY_TRACT
  Filled 2016-06-17: qty 8.8

## 2016-06-17 MED ORDER — ENSURE ENLIVE PO LIQD: 237.0000 mL | Freq: Two times a day (BID) | ORAL | Status: DC

## 2016-06-17 MED ORDER — MORPHINE SULFATE (PF) 2 MG/ML IV SOLN: 2.0000 mg | INTRAVENOUS | Status: DC | PRN

## 2016-06-17 MED ORDER — TIMOLOL MALEATE 0.5 % OP SOLN: 1.0000 [drp] | OPHTHALMIC | Status: DC

## 2016-06-17 NOTE — ED Notes (Signed)
Patient has BBB with occ unifocal PVCs

## 2016-06-17 NOTE — ED Triage Notes (Signed)
Per EMS patient from home. Patient called out due to chest pain. Patient took 324 mg of Aspirin at home. Patient was given 1 nitro by EMS with some reduction of pain.  Patient was seen last night but went home AMA.

## 2016-06-17 NOTE — Telephone Encounter (Signed)
I reviewed the chart. I saw her on one occasion last year for consultation. If she is having active, recurring chest pain symptoms, I would agree that hospitalization would be the smartest option. That way she could be monitored for changes in her cardiac enzymes, placed on telemetry to assess cardiac rhythm (she does have a history of palpitations as well), and likely undergo an inpatient Myoview study for ischemic assessment. Deferring this to try and arrange an office visit would only serve to delay her testing further.

## 2016-06-17 NOTE — ED Provider Notes (Signed)
[facility]-EM[facility] DEPT Provider Note   CSN: [id number] Arrival date & time: 06/17/16  1552     History   Chief Complaint Chief Complaint  Patient presents with  . Chest Pain    HPI Albertina ParrKathleen Symons is a 83 y.o. female.  Patient seen here last night for similar chest pain more as a discomfort substernal area. Patient is troponin workup last evening was negative. Patient had recurrent pain today comes and goes last of maybe 3045 minutes at a time. Difficult to get exact time history but is clear that it's intermittent. Patient took an aspirin at home before a calling EMS. And patient by EMS was given one not nitroglycerin with maybe slight relief in pain. Pain substernal she doesn't call it pain she causes a pressure radiates to her back into her left arm. Patient's normally on 3 L of oxygen at all times. She is a resident of [facility].   Correction to note apparently according to notes patient has stated was seen last night and left AMA from the ED.  Patient is willing to stay today. Patient been followed by cardiology last saw him about a year ago. Had an echocardiogram in 2012 and had a Cardiolite study in 2013. Followed by Dr. Diona BrownerMcDowell.      Past Medical History:  Diagnosis Date  . Allergic rhinitis   . Carotid artery disease (HCC)    1-50% bilateral ICA stenosis 2011 [facility]  . COPD (chronic obstructive pulmonary disease) (HCC)   . History of glaucoma   . Macular degeneration   . Palpitations     Patient Active Problem List   Diagnosis Date Noted  . Chest pain 11/13/2010  . Dyspnea on exertion 11/13/2010  . Pulmonary hypertension (HCC) 11/13/2010  . Valvular heart disease 11/13/2010    Past Surgical History:  Procedure Laterality Date  . APPENDECTOMY    . CATARACT EXTRACTION Bilateral     OB History    No data available       Home Medications    Prior to Admission medications   Medication Sig Start Date End Date Taking?  Authorizing Provider  ALPRAZolam Prudy Feeler(XANAX) 0.25 MG tablet Take 0.25 mg by mouth at bedtime as needed for anxiety.    [provider]  aspirin 81 MG tablet Take 81 mg by mouth daily.      [provider]  Budesonide-Formoterol Fumarate (SYMBICORT IN) Inhale into the lungs.    [provider]  Calcium-Vitamin D 600-125 MG-UNIT TABS Take by mouth daily.      [provider]  Cholecalciferol (VITAMIN D3) 1000 UNITS CAPS Take by mouth daily.      [provider]  DOXYCYCLINE CALCIUM PO Take 100 mg by mouth.    [provider]  fish oil-omega-3 fatty acids 1000 MG capsule Take 1 g by mouth daily.      [provider]  polyethylene glycol (MIRALAX / GLYCOLAX) packet Take 17 g by mouth daily. 06/02/14   Loren RacerYelverton, David, MD  timolol (BETIMOL) 0.25 % ophthalmic solution Place 1 drop into both eyes 2 (two) times daily.      [provider]    Family History Family History  Problem Relation Age of Onset  . Heart failure Father   . Heart failure Sister   . Heart failure Mother   . Stroke Sister   . Cancer Sister   . Alzheimer's disease Brother     Social History Social History  Substance Use  Topics  . Smoking status: Never Smoker  . Smokeless tobacco: Never Used  . Alcohol use No     Allergies   Prednisone   Review of Systems Review of Systems  Constitutional: Negative for fever.  HENT: Negative for congestion.   Eyes: Negative for visual disturbance.  Respiratory: Negative for shortness of breath.   Cardiovascular: Positive for chest pain. Negative for palpitations and leg swelling.  Gastrointestinal: Negative for abdominal pain, nausea and vomiting.  Genitourinary: Negative for dysuria.  Musculoskeletal: Positive for back pain.  Skin: Negative for rash.  Neurological: Negative for headaches.  Hematological: Does not bruise/bleed easily.  Psychiatric/Behavioral: Negative for confusion.     Physical  Exam Updated Vital Signs BP 125/75   Pulse 72   Temp 98 F (36.7 C) (Oral)   Resp (!) 23   Ht 5' (1.524 m)   Wt 44 kg   SpO2 99%   BMI 18.94 kg/m   Physical Exam  Constitutional: She is oriented to person, place, and time. She appears well-developed and well-nourished. No distress.  HENT:  Head: Normocephalic and atraumatic.  Mouth/Throat: Oropharynx is clear and moist.  Eyes: EOM are normal. Pupils are equal, round, and reactive to light.  Neck: Normal range of motion. Neck supple.  Cardiovascular: Normal rate, regular rhythm and normal heart sounds.   Pulmonary/Chest: Effort normal and breath sounds normal. No respiratory distress.  Abdominal: Bowel sounds are normal.  Musculoskeletal: She exhibits no edema.  Neurological: She is alert and oriented to person, place, and time. No cranial nerve deficit or sensory deficit. She exhibits normal muscle tone. Coordination normal.  Skin: Skin is warm.  Nursing note and vitals reviewed.    ED Treatments / Results  Labs (all labs ordered are listed, but only abnormal results are displayed) Labs Reviewed  BASIC METABOLIC PANEL - Abnormal; Notable for the following:       Result Value   Chloride 98 (*)    CO2 35 (*)    Glucose, Bld 118 (*)    All other components within normal limits  CBC  TROPONIN I  D-DIMER, QUANTITATIVE (NOT AT [facility])   Results for orders placed or performed during the hospital encounter of 06/17/16  Basic metabolic panel  Result Value Ref Range   Sodium 140 135 - 145 mmol/L   Potassium 4.1 3.5 - 5.1 mmol/L   Chloride 98 (L) 101 - 111 mmol/L   CO2 35 (H) 22 - 32 mmol/L   Glucose, Bld 118 (H) 65 - 99 mg/dL   BUN 13 6 - 20 mg/dL   Creatinine, Ser 1.61 0.44 - 1.00 mg/dL   Calcium 9.0 8.9 - 09.6 mg/dL   GFR calc non Af Amer >60 >60 mL/min   GFR calc Af Amer >60 >60 mL/min   Anion gap 7 5 - 15  CBC  Result Value Ref Range   WBC 9.5 4.0 - 10.5 K/uL   RBC 4.21 3.87 - 5.11 MIL/uL   Hemoglobin 12.2 12.0 -  15.0 g/dL   HCT 04.5 40.9 - 81.1 %   MCV 91.9 78.0 - 100.0 fL   MCH 29.0 26.0 - 34.0 pg   MCHC 31.5 30.0 - 36.0 g/dL   RDW 91.4 78.2 - 95.6 %   Platelets 211 150 - 400 K/uL  Troponin I  Result Value Ref Range   Troponin I <0.03 <0.03 ng/mL  D-dimer, quantitative (not at [facility])  Result Value Ref Range   D-Dimer, Quant 0.31 0.00 - 0.50 ug/mL-FEU  EKG  EKG Interpretation  Date/Time:  Wednesday Jun 17 2016 15:56:14 EDT Ventricular Rate:  83 PR Interval:    QRS Duration: 133 QT Interval:  399 QTC Calculation: 469 R Axis:   -42 Text Interpretation:  Sinus rhythm RBBB and LAFB Left ventricular hypertrophy Inferior infarct, acute Anterolateral Q waves, probably due to LVH Lateral leads are also involved Baseline wander in lead(s) I aVR No significant change since last tracing Confirmed by Nawal Burling  MD, Westyn Keatley [phone]) on 06/17/2016 4:05:19 PM       Radiology Dg Chest 2 View  Result Date: 06/17/2016 CLINICAL DATA:  Chest pain. EXAM: CHEST  2 VIEW COMPARISON:  Radiographs of Jun 16, 2016. FINDINGS: Stable cardiomediastinal silhouette. Atherosclerosis of thoracic aorta is noted. Stable biapical scarring and fibrosis is noted. No pneumothorax or significant pleural effusion is noted. Old sternal fracture is noted. Stable bibasilar scarring is noted. No acute pulmonary disease is noted. IMPRESSION: Aortic atherosclerosis. Stable bilateral lung scarring. No acute abnormality is noted. Electronically Signed   By: Lupita Raider, M.D.   On: 06/17/2016 16:57   Dg Chest 2 View  Result Date: 06/16/2016 CLINICAL DATA:  Chest pain with chronic shortness of breath EXAM: CHEST  2 VIEW COMPARISON:  June 02, 2014 FINDINGS: There is fibrosis in both lung apices, stable. There is no appreciable edema or consolidation. Heart size is within normal limits. There is distortion of pulmonary vascularity in the upper lobes due to new cicatrization in the lung apices. This is a stable finding. There is aortic  atherosclerosis. No adenopathy. There is thoracic scoliosis, convex right in the upper thoracic region and convex to the left more inferiorly. IMPRESSION: Apical fibrosis bilaterally. No edema or consolidation. Stable cardiac silhouette. Aortic atherosclerosis. No evident adenopathy. Electronically Signed   By: Bretta Bang III M.D.   On: 06/16/2016 17:32    Procedures Procedures (including critical care time)  Medications Ordered in ED Medications - No data to display   Initial Impression / Assessment and Plan / ED Course  I have reviewed the triage vital signs and the nursing notes.  Pertinent labs & imaging results that were available during my care of the patient were reviewed by me and considered in my medical decision making (see chart for details).     Patient returns today with the persistent intermittent chest pain. Described more as a pressure. Radiates to the back radiates to her left arm. Not truly pain. Patient very concerned about the chest pain being related to her heart.  Patient followed by cardiology in the past but has not been seen recently. Patient had echocardiogram in 2012 and had a Cardiolite study in 2013 without significant findings.   Patient seen last evening troponin was negative then. The patient states she's had intermittent chest pain sometimes lasting 30 minutes on and off throughout the day. EMS gave her 1 sublingual nitroglycerin without really any change. Maybe slight relief. Patient without history complaint right now. Patient also had taken a complete aspirin at home.  Workup here today repeat troponins negative. D-dimer also negative. A sick labs without significant abnormalities. Chest x-ray without any acute findings.  Patient will require admission for cardiac rule out due to the intermittent and recurrent nature of the chest pain. Will probably also benefit from follow-up with cardiology perhaps get crit allergy to do an echocardiogram in the  morning.  Final Clinical Impressions(s) / ED Diagnoses   Final diagnoses:  Precordial pain    New Prescriptions New Prescriptions  No medications on file     Vanetta Mulders, MD 06/17/16 1815

## 2016-06-17 NOTE — Telephone Encounter (Signed)
Daughter Lynden Ang(Cathy) called stating that mother was seen in the ER [facility] last night for chest pains. She lives at the [facility]. Mother called daughter stating that she is in pain .  Daughter is uncertain as to what type of pain because mother is crying.

## 2016-06-17 NOTE — H&P (Signed)
History and Physical    Olivia Mccormick ZOX:[id number] DOB: January 09, 1926 DOA: 06/17/2016  PCP: Ignatius Specking, MD Consultants:  Diona Browner - cardiology; Juanetta Gosling - pulmonology Patient coming from: retirement home, [place], independently living; [facility]: 3 children, [phone]  Chief Complaint: chest pressure  HPI: Olivia Mccormick is a 83 y.o. female with medical history significant of COPD on 3L home O2, carotid artery disease, and macular degeneration presenting with chest tightness, substernal.  It has been present for 2-3 weeks, comes and goes.  She can't tell what makes it worse.  Does not always correlate with exertion.  Better when she sits and tries to be quiet.  Sometimes it lasts 10-20 minutes but it is not regular so she isn't sure.  It does prevent her from going to sleep but never wakes her from sleep.  Wears 3L home O2.  Sometimes she does feel more SOB but it does not correlate with the chest pressure.  No nausea.  No diaphoresis.  Occasionally feels like her heart beats faster than at other times.  Last stress test was in 2013 at [facility].  She was seen in the ER last night and recommended for observation, which she declined.  She called cardiology today and they suggested admission.    Does not see out of right eye and losing vision on the left.  Slight pain underneath left arm.  Also pain in right shoulder.   ED Course: Negative troponin, D-dimer, CXR.  Review of Systems: As per HPI; otherwise review of systems reviewed and negative.   Ambulatory Status:  Ambulates with a walker  Past Medical History:  Diagnosis Date  . Allergic rhinitis   . Carotid artery disease (HCC)    1-50% bilateral ICA stenosis 2011 [facility]  . COPD (chronic obstructive pulmonary disease) (HCC)    3L home O2  . History of glaucoma   . Macular degeneration   . Palpitations     Past Surgical History:  Procedure Laterality Date  . APPENDECTOMY    . CATARACT EXTRACTION  Bilateral     Social History   Social History  . Marital status: Widowed    Spouse name: N/A  . Number of children: N/A  . Years of education: N/A   Occupational History  . RETIRED beautician    Social History Main Topics  . Smoking status: Never Smoker  . Smokeless tobacco: Never Used  . Alcohol use No  . Drug use: No  . Sexual activity: Not on file   Other Topics Concern  . Not on file   Social History Narrative  . No narrative on file    Allergies  Allergen Reactions  . Prednisone     Unknown reaction    Family History  Problem Relation Age of Onset  . Heart failure Father   . Heart failure Sister   . Heart failure Mother   . Stroke Sister   . Cancer Sister   . Alzheimer's disease Brother     Prior to Admission medications   Medication Sig Start Date End Date Taking? Authorizing Provider  ALPRAZolam Prudy Feeler) 0.25 MG tablet Take 0.25 mg by mouth at bedtime as needed for anxiety.    [provider]  aspirin 81 MG tablet Take 81 mg by mouth daily.      [provider]  Budesonide-Formoterol Fumarate (SYMBICORT IN) Inhale into the lungs.    [provider]  Calcium-Vitamin D 600-125 MG-UNIT TABS Take by mouth daily.  [provider]  Cholecalciferol (VITAMIN D3) 1000 UNITS CAPS Take by mouth daily.      [provider]  DOXYCYCLINE CALCIUM PO Take 100 mg by mouth.    [provider]  fish oil-omega-3 fatty acids 1000 MG capsule Take 1 g by mouth daily.      [provider]  polyethylene glycol (MIRALAX / GLYCOLAX) packet Take 17 g by mouth daily. 06/02/14   Loren Racer, MD  timolol (BETIMOL) 0.25 % ophthalmic solution Place 1 drop into both eyes 2 (two) times daily.      [provider]    Physical Exam: Vitals:   06/17/16 1830 06/17/16 1845 06/17/16 1849 06/17/16 1950  BP: (!) 152/69   124/83  Pulse: 71 73  71  Resp: (!) 21 (!) 26    Temp:   98.5 F (36.9 C) 98.5 F (36.9 C)   TempSrc:   Oral Oral  SpO2: 97% 96%  95%  Weight:    43.3 kg (95 lb 6.4 oz)  Height:    5' (1.524 m)     General:  Appears calm and comfortable and is NAD Eyes:  PERRL, EOMI, normal lids, iris ENT:  grossly normal hearing, lips & tongue, mmm Neck:  no LAD, masses or thyromegaly Cardiovascular:  RRR, no m/r/g. No LE edema.  Respiratory:  CTA bilaterally, no w/r/r. Normal respiratory effort. Abdomen:  soft, ntnd, NABS Skin:  no rash or induration seen on limited exam Musculoskeletal:  grossly normal tone BUE/BLE, good ROM, no bony abnormality Psychiatric:  grossly normal mood and affect, speech fluent and appropriate, AOx3 Neurologic:  CN 2-12 grossly intact, moves all extremities in coordinated fashion, sensation intact  Labs on Admission: I have personally reviewed following labs and imaging studies  CBC:  Recent Labs Lab 06/16/16 1828 06/17/16 1621  WBC 7.8 9.5  HGB 13.6 12.2  HCT 44.1 38.7  MCV 93.6 91.9  PLT 221 211   Basic Metabolic Panel:  Recent Labs Lab 06/16/16 1828 06/17/16 1621  NA 144 140  K 4.6 4.1  CL 99* 98*  CO2 39* 35*  GLUCOSE 126* 118*  BUN 11 13  CREATININE 0.38* 0.45  CALCIUM 9.6 9.0   GFR: Estimated Creatinine Clearance: 33.2 mL/min (by C-G formula based on SCr of 0.45 mg/dL). Liver Function Tests: No results for input(s): AST, ALT, ALKPHOS, BILITOT, PROT, ALBUMIN in the last 168 hours. No results for input(s): LIPASE, AMYLASE in the last 168 hours. No results for input(s): AMMONIA in the last 168 hours. Coagulation Profile: No results for input(s): INR, PROTIME in the last 168 hours. Cardiac Enzymes:  Recent Labs Lab 06/16/16 1828 06/17/16 1621 06/17/16 1958  TROPONINI <0.03 <0.03 <0.03   BNP (last 3 results) No results for input(s): PROBNP in the last 8760 hours. HbA1C: No results for input(s): HGBA1C in the last 72 hours. CBG: No results for input(s): GLUCAP in the last 168 hours. Lipid Profile: No results for input(s):  CHOL, HDL, LDLCALC, TRIG, CHOLHDL, LDLDIRECT in the last 72 hours. Thyroid Function Tests: No results for input(s): TSH, T4TOTAL, FREET4, T3FREE, THYROIDAB in the last 72 hours. Anemia Panel: No results for input(s): VITAMINB12, FOLATE, FERRITIN, TIBC, IRON, RETICCTPCT in the last 72 hours. Urine analysis:    Component Value Date/Time   COLORURINE YELLOW 06/02/2014 0350   APPEARANCEUR CLEAR 06/02/2014 0350   LABSPEC 1.020 06/02/2014 0350   PHURINE 5.5 06/02/2014 0350   GLUCOSEU NEGATIVE 06/02/2014 0350   HGBUR NEGATIVE 06/02/2014 0350  BILIRUBINUR NEGATIVE 06/02/2014 0350   KETONESUR NEGATIVE 06/02/2014 0350   PROTEINUR NEGATIVE 06/02/2014 0350   UROBILINOGEN 0.2 06/02/2014 0350   NITRITE NEGATIVE 06/02/2014 0350   LEUKOCYTESUR SMALL (A) 06/02/2014 0350    Creatinine Clearance: Estimated Creatinine Clearance: 33.2 mL/min (by C-G formula based on SCr of 0.45 mg/dL).  Sepsis Labs: @LABRCNTIP (procalcitonin:4,lacticidven:4) )No results found for this or any previous visit (from the past 240 hour(s)).   Radiological Exams on Admission: Dg Chest 2 View  Result Date: 06/17/2016 CLINICAL DATA:  Chest pain. EXAM: CHEST  2 VIEW COMPARISON:  Radiographs of Jun 16, 2016. FINDINGS: Stable cardiomediastinal silhouette. Atherosclerosis of thoracic aorta is noted. Stable biapical scarring and fibrosis is noted. No pneumothorax or significant pleural effusion is noted. Old sternal fracture is noted. Stable bibasilar scarring is noted. No acute pulmonary disease is noted. IMPRESSION: Aortic atherosclerosis. Stable bilateral lung scarring. No acute abnormality is noted. Electronically Signed   By: Lupita Raider, M.D.   On: 06/17/2016 16:57   Dg Chest 2 View  Result Date: 06/16/2016 CLINICAL DATA:  Chest pain with chronic shortness of breath EXAM: CHEST  2 VIEW COMPARISON:  June 02, 2014 FINDINGS: There is fibrosis in both lung apices, stable. There is no appreciable edema or consolidation. Heart  size is within normal limits. There is distortion of pulmonary vascularity in the upper lobes due to new cicatrization in the lung apices. This is a stable finding. There is aortic atherosclerosis. No adenopathy. There is thoracic scoliosis, convex right in the upper thoracic region and convex to the left more inferiorly. IMPRESSION: Apical fibrosis bilaterally. No edema or consolidation. Stable cardiac silhouette. Aortic atherosclerosis. No evident adenopathy. Electronically Signed   By: Bretta Bang III M.D.   On: 06/16/2016 17:32    EKG: Independently reviewed.  NSR with rate 83; RBBB and LAFB; LVH; nonspecific ST changes with No significant change since last tracing    Assessment/Plan Principal Problem:   Chest pain Active Problems:   COPD (chronic obstructive pulmonary disease) (HCC)   Hyperglycemia   Chest pain -Patient with substernal chest pressure that has come on intermittently for days to weeks, appears to resolve spontaneously. -1/3 typical symptoms suggestive of noncardiac chest pain.  -CXR unremarkable.   -Initial cardiac troponin negative.  -EKG not indicative of acute ischemia.   -GRACE score is 135; which predicts an in-hospital death rate of 2.5%.  -Will plan to place in observation status on telemetry to rule out ACS by overnight observation.  -cycle troponin q6h x 3 and repeat EKG in AM -Start ASA 81 mg  daily -morphine given -Cardiology consultation in AM - NPO for possible stress test   Hyperglycemia -Glucose 118 -May be stress response -Will follow with fasting AM labs -It is unlikely that he will need acute or chronic treatment for this issue  COPD -Chronic respiratory failure -Continue 3L home O2 -No current evidence of exacerbation at this time   DVT prophylaxis:  Lovenox Code Status: DNR - confirmed with patient/family Family Communication: Daughter present througohut Disposition Plan: Home once clinically improved - BUT she lives in  independent living and may need to consider moving up to assisted living at some point in the fairly near future Consults called: Cardiology Admission status: It is my clinical opinion that referral for OBSERVATION is reasonable and necessary in this patient based on the above information provided. The aforementioned taken together are felt to place the patient at high risk for further clinical deterioration. However it is anticipated  that the patient may be medically stable for discharge from the hospital within 24 to 48 hours.    Jonah BlueJennifer Jancy Sprankle MD [facility]  If 7PM-7AM, please contact night-coverage [email or web address] Password [email or web address]  06/17/2016, 8:42 PM

## 2016-06-17 NOTE — Telephone Encounter (Signed)
Per daughter, patient was seen by Keevie Hairfield yesterday and was Ester Rinktold that she needed to go to the ED to evaluate her chest pain. [facility] and refused to be admitted per ED note. Spoke with patient and she c/o having upper back pain 2 hours rated 5/10 that lasted a minute. Patient c/o intermittent chest tightness 7/10, increased sob at times and some dizziness. Patient denies swelling, fatigue, n/v, chills or fever. Patient c/o active chest tightness rated 4/10 without dizziness or sob. Patient said she was able to made her bed today and started with her regular exercise but she got interrupted so did not finish her exercise. Patient is requesting an appointment with a cardiologist. Patient advised that this message would be sent to her doctor and if her symptoms got worse, that she needed to go to the ED for an evaluation. Patient verbalized understanding of plan.

## 2016-06-17 NOTE — Telephone Encounter (Signed)
Patient and daughter informed and verbalized understanding of plan. 

## 2016-06-18 ENCOUNTER — Encounter (HOSPITAL_COMMUNITY): Payer: Self-pay | Admitting: Physician Assistant

## 2016-06-18 ENCOUNTER — Observation Stay (HOSPITAL_BASED_OUTPATIENT_CLINIC_OR_DEPARTMENT_OTHER): Payer: Medicare Other

## 2016-06-18 DIAGNOSIS — J9611 Chronic respiratory failure with hypoxia: Secondary | ICD-10-CM

## 2016-06-18 DIAGNOSIS — I700 Atherosclerosis of aorta: Secondary | ICD-10-CM

## 2016-06-18 DIAGNOSIS — R079 Chest pain, unspecified: Secondary | ICD-10-CM

## 2016-06-18 DIAGNOSIS — J449 Chronic obstructive pulmonary disease, unspecified: Secondary | ICD-10-CM | POA: Diagnosis not present

## 2016-06-18 LAB — MRSA PCR SCREENING: MRSA by PCR: NEGATIVE

## 2016-06-18 LAB — NM MYOCAR MULTI W/SPECT W/WALL MOTION / EF
CHL CUP NUCLEAR SSS: 8
LHR: 0.91
LVDIAVOL: 43 mL (ref 46–106)
LVSYSVOL: 8 mL
NUC STRESS TID: 0.91
Peak HR: 116 {beats}/min
Rest HR: 68 {beats}/min
SDS: 1
SRS: 7

## 2016-06-18 LAB — TROPONIN I

## 2016-06-18 LAB — TSH: TSH: 1.085 u[IU]/mL (ref 0.350–4.500)

## 2016-06-18 MED ORDER — DILTIAZEM HCL 30 MG PO TABS: 30.0000 mg | ORAL_TABLET | Freq: Two times a day (BID) | ORAL | Status: DC

## 2016-06-18 MED ORDER — TECHNETIUM TC 99M TETROFOSMIN IV KIT
10.0000 | PACK | Freq: Once | INTRAVENOUS | Status: AC | PRN
  Administered 2016-06-18: 10 via INTRAVENOUS

## 2016-06-18 MED ORDER — TECHNETIUM TC 99M TETROFOSMIN IV KIT
30.0000 | PACK | Freq: Once | INTRAVENOUS | Status: AC | PRN
  Administered 2016-06-18: 30 via INTRAVENOUS

## 2016-06-18 MED ORDER — REGADENOSON 0.4 MG/5ML IV SOLN
INTRAVENOUS | Status: AC
  Administered 2016-06-18: 0.4 mg via INTRAVENOUS
  Filled 2016-06-18: qty 5

## 2016-06-18 MED ORDER — SODIUM CHLORIDE 0.9% FLUSH
INTRAVENOUS | Status: AC
  Administered 2016-06-18: 10 mL via INTRAVENOUS
  Filled 2016-06-18: qty 10

## 2016-06-18 MED ORDER — POLYETHYLENE GLYCOL 3350 17 G PO PACK: 17.0000 g | PACK | Freq: Two times a day (BID) | ORAL | Status: DC

## 2016-06-18 MED ORDER — ONDANSETRON HCL 4 MG/2ML IJ SOLN: 4.0000 mg | Freq: Once | INTRAMUSCULAR | Status: DC

## 2016-06-18 MED ORDER — DILTIAZEM HCL 30 MG PO TABS: 30.0000 mg | ORAL_TABLET | Freq: Two times a day (BID) | ORAL | 1 refills | Status: DC

## 2016-06-18 NOTE — Progress Notes (Signed)
LCSW consulted for "from facility"  Patient is from independent living and does not require SW intervention for return. If other needs arise, please re-consult.  Deretha EmoryHannah Sopheap Basic LCSW, MSW Clinical Social Work: Optician, dispensingystem Wide Float Coverage for :  [phone]

## 2016-06-18 NOTE — Progress Notes (Signed)
Patient discharged with instructions, prescription, and care notes.  Verbalized understanding via teach back.  IV was removed and the site was WNL. Patient voiced no further complaints or concerns at the time of discharge.  Appointments scheduled per instructions.  Patient left the floor via w/c family  And staff in stable condition. 

## 2016-06-18 NOTE — Progress Notes (Signed)
Nuc result low risk. Per d/w Dr. Wyline MoodBranch, add diltiazem short acting 30mg  BID for atrial tach. We will arrange OP f/u. Reviewed with patient's daughter. Also sent text page to IM to inform them of plan. Rx sent in for diltiazem.  Jalyssa Fleisher PA-C

## 2016-06-18 NOTE — Care Management Obs Status (Signed)
MEDICARE OBSERVATION STATUS NOTIFICATION   Patient Details  Name: Olivia ParrKathleen Fahey MRN: [id number] Date of Birth: 04-Aug-1925   Medicare Observation Status Notification Given:  Yes    Malcolm MetroChildress, Baylin Cabal Demske, RN 06/18/2016, 9:34 AM

## 2016-06-18 NOTE — Discharge Summary (Signed)
Physician Discharge Summary  Shaquia Berkley ZOX:[id number] DOB: Feb 05, 1926 DOA: 06/17/2016  PCP: Ignatius Specking, MD  Admit date: 06/17/2016 Discharge date: 06/18/2016  Recommendations for Outpatient Follow-up:  1. Cardiology will arrange outpatient follow-up for chest pain.    Discharge Diagnoses:  1. Chest pain 2. COPD 3. Chronic hypoxic respiratory failure 4. Aortic atherosclerosis  Discharge Condition: Improved Disposition: Home  Diet recommendation: Heart healthy  Filed Weights   06/17/16 1555 06/17/16 1950  Weight: 44 kg (97 lb) 43.3 kg (95 lb 6.4 oz)    History of present illness:  83 year old woman PMH COPD on 3 L nasal cannula oxygen at home, coronary artery disease, presenting with chest pain. Admitted for further evaluation.  Hospital Course:  Observed overnight. Chest pain resolved. Troponins negative. EKG nonacute. ACS ruled out. Evaluated by cardiology, underwent nuclear stress test which was reported as low risk. Brief atrial tachycardia noted, cardiology recommended starting diltiazem. Hospitalization was uncomplicated, COPD stable.  Today's assessment: S: Feels well. No chest pain. No shortness of breath. O: Vitals:   06/18/16 1000 06/18/16 1300  BP: 132/63 121/68  Pulse: 71 77  Resp: 17 18  Temp: 98.6 F (37 C) 98.7 F (37.1 C)   Constitutional: Appears calm, comfortable. Cardiovascular: Regular rate and rhythm. No murmur, rub or gallop. Respiratory: Clear to auscultation bilaterally. No wheezes, rales or rhonchi. Normal respiratory effort. Psychiatric: Grossly normal mood and affect. Speech fluent and appropriate.   Discharge Instructions  Discharge Instructions    Activity as tolerated - No restrictions    Complete by:  As directed    Diet - low sodium heart healthy    Complete by:  As directed    Discharge instructions    Complete by:  As directed    Call your physician or seek immediate medical attention for chest pain, shoulder pain,  shortness of breath, swelling, heavy sweating, nausea, or worsening of condition.     Allergies as of 06/18/2016      Reactions   Prednisone    Unknown reaction      Medication List    TAKE these medications   ALPRAZolam 0.25 MG tablet Commonly known as:  XANAX Take 0.25 mg by mouth 2 (two) times daily.   aspirin 81 MG tablet Take 81 mg by mouth at bedtime.   Calcium-Vitamin D 600-125 MG-UNIT Tabs Take 1 tablet by mouth daily.   diltiazem 30 MG tablet Commonly known as:  CARDIZEM Take 1 tablet (30 mg total) by mouth 2 (two) times daily.   fish oil-omega-3 fatty acids 1000 MG capsule Take 1 g by mouth daily.   polyethylene glycol packet Commonly known as:  MIRALAX / GLYCOLAX Take 17 g by mouth 2 (two) times daily.   PROAIR HFA 108 (90 Base) MCG/ACT inhaler Generic drug:  albuterol Inhale 2 puffs into the lungs 4 (four) times daily.   SYMBICORT 160-4.5 MCG/ACT inhaler Generic drug:  budesonide-formoterol Inhale 2 puffs into the lungs 2 (two) times daily. *Rinse after use   timolol 0.5 % ophthalmic solution Commonly known as:  TIMOPTIC Place 1 drop into both eyes as directed.   Vitamin D3 1000 units Caps Take by mouth daily.      Allergies  Allergen Reactions  . Prednisone     Unknown reaction    The results of significant diagnostics from this hospitalization (including imaging, microbiology, ancillary and laboratory) are listed below for reference.    Significant Diagnostic Studies: Dg Chest 2 View  Result Date: 06/17/2016 CLINICAL DATA:  Chest pain. EXAM: CHEST  2 VIEW COMPARISON:  Radiographs of Jun 16, 2016. FINDINGS: Stable cardiomediastinal silhouette. Atherosclerosis of thoracic aorta is noted. Stable biapical scarring and fibrosis is noted. No pneumothorax or significant pleural effusion is noted. Old sternal fracture is noted. Stable bibasilar scarring is noted. No acute pulmonary disease is noted. IMPRESSION: Aortic atherosclerosis. Stable bilateral  lung scarring. No acute abnormality is noted. Electronically Signed   By: Lupita RaiderJames  Green Jr, M.D.   On: 06/17/2016 16:57   Dg Chest 2 View  Result Date: 06/16/2016 CLINICAL DATA:  Chest pain with chronic shortness of breath EXAM: CHEST  2 VIEW COMPARISON:  June 02, 2014 FINDINGS: There is fibrosis in both lung apices, stable. There is no appreciable edema or consolidation. Heart size is within normal limits. There is distortion of pulmonary vascularity in the upper lobes due to new cicatrization in the lung apices. This is a stable finding. There is aortic atherosclerosis. No adenopathy. There is thoracic scoliosis, convex right in the upper thoracic region and convex to the left more inferiorly. IMPRESSION: Apical fibrosis bilaterally. No edema or consolidation. Stable cardiac silhouette. Aortic atherosclerosis. No evident adenopathy. Electronically Signed   By: Bretta BangWilliam  Woodruff III M.D.   On: 06/16/2016 17:32   Nm Myocar Multi W/spect W/wall Motion / Ef  Result Date: 06/18/2016  No diagnostic ST segment changes to indicate ischemia.  Small, mild intensity, mid to apical inferior defect that is fixed and more prominent on rest imaging suggesting attenuation artifact. No large ischemic territories noted.  This is a low risk study.  Nuclear stress EF: 82%.     Microbiology: Recent Results (from the past 240 hour(s))  MRSA PCR Screening     Status: None   Collection Time: 06/17/16  9:41 PM  Result Value Ref Range Status   MRSA by PCR NEGATIVE NEGATIVE Final    Comment:        The GeneXpert MRSA Assay (FDA approved for NASAL specimens only), is one component of a comprehensive MRSA colonization surveillance program. It is not intended to diagnose MRSA infection nor to guide or monitor treatment for MRSA infections.      Labs: Basic Metabolic Panel:  Recent Labs Lab 06/16/16 1828 06/17/16 1621  NA 144 140  K 4.6 4.1  CL 99* 98*  CO2 39* 35*  GLUCOSE 126* 118*  BUN 11 13    CREATININE 0.38* 0.45  CALCIUM 9.6 9.0   CBC:  Recent Labs Lab 06/16/16 1828 06/17/16 1621  WBC 7.8 9.5  HGB 13.6 12.2  HCT 44.1 38.7  MCV 93.6 91.9  PLT 221 211   Cardiac Enzymes:  Recent Labs Lab 06/16/16 1828 06/17/16 1621 06/17/16 1958 06/18/16 0221 06/18/16 0800  TROPONINI <0.03 <0.03 <0.03 <0.03 <0.03    Principal Problem:   Chest pain Active Problems:   COPD (chronic obstructive pulmonary disease) (HCC)   Hyperglycemia   Time coordinating discharge: 35 minutes  Signed:  Brendia Sacksaniel Goodrich, MD [facility] 06/18/2016, 4:49 PM

## 2016-06-18 NOTE — Consult Note (Signed)
Cardiology Consultation Note    Patient ID: Olivia Mccormick, MRN: [id number], DOB/AGE: [birth year] 83 y.o. Admit date: 06/17/2016   Date of Consult: 06/18/2016 Primary Physician: Ignatius Specking, MD Primary Cardiologist: Dr. Diona Browner  Chief Complaint: chest pressure Reason for Consultation: chest pressure Requesting MD: Dr. Irene Limbo  HPI: Olivia Mccormick is a 83 y.o. female with a history of COPD (secondhand tobacco/beauty shop exposure) with chronic respiratory failure on 3L home O2, glaucoma, prior elevated PASP, mild-mod AI/MR/TR, hyperlipidemia, mild carotid disease by duplex 2011 (1-50% bilaterally), DNR whom we are asked to see by Dr. Irene Limbo for chest pain. Prior cardiac workup includes Lexsican nuc in 2013 which was low risk. Last echo 06/2015 showed EF 60-65%, no RWMA, mild-mod AI/MR, mildly dilated RV/RA, mild-mod TR, PASP . Event monitor 05/2015 showed NSR-sinus tach with HR 58 up to 138 bpm, rare PACs, PVCs, 4-beat atrial run noted.  Initially the patient reported her symptoms have been occurring for 2-3 weeks but her daughter reminded her that she has a long history of chest tightness going back an undetermined amount of time. This is what prompted prior stress testing. However, it sounds like it has gotten more prominent recently. A few times a day she's begun to notice a central chest pressure without any acutely associated symptoms (no nausea, vomiting, diaphoresis, palpitations). This lasts anywhere from 5-15 minutes. It can happen both at rest and when she's doing something, but there is no particular pattern to it. It does not occur every time she exerts herself. She says she tries to stay active doing home exercises throughout the day and has not noticed any increasing chest tightness while doing so. She does have chronic DOE which she feels is slightly worse than usual - this does not correlate with the chest pressure. Discomfort is not worse with inspiration, palpation or  specific movements. No LEE, bleeding, weight gain. Labs showed K 4.1, Cr 0.45, troponins neg x 5, CBC wnl, d-dimer negative. VSS. Larey Seat about a week ago while moving about in the kitchen - went to change positions and fell to the floor but no syncope or obvious injury. Walks with a walker. Recent 2lb weight loss with generally diminished appetite. Telemetry notable for NSR with occ PVCs and one brief run of what appears to be SVT/atrial tach. Has family hx of CHF but only CAD is in her 32 year old sister who had a heart attack.   Past Medical History:  Diagnosis Date  . Allergic rhinitis   . Carotid artery disease (HCC)    1-50% bilateral ICA stenosis 2011 [facility]  . COPD (chronic obstructive pulmonary disease) (HCC)    3L home O2  . History of glaucoma   . Macular degeneration   . Palpitations       Surgical History:  Past Surgical History:  Procedure Laterality Date  . APPENDECTOMY    . CATARACT EXTRACTION Bilateral      Home Meds: Prior to Admission medications   Medication Sig Start Date End Date Taking? Authorizing Provider  albuterol (PROAIR HFA) 108 (90 Base) MCG/ACT inhaler Inhale 2 puffs into the lungs 4 (four) times daily.   Yes [provider]  ALPRAZolam (XANAX) 0.25 MG tablet Take 0.25 mg by mouth 2 (two) times daily.    Yes [provider]  aspirin 81 MG tablet Take 81 mg by mouth at bedtime.    Yes [provider]  Calcium-Vitamin D 600-125 MG-UNIT TABS Take 1 tablet by mouth  daily.    Yes [provider]  Cholecalciferol (VITAMIN D3) 1000 UNITS CAPS Take by mouth daily.     Yes [provider]  fish oil-omega-3 fatty acids 1000 MG capsule Take 1 g by mouth daily.     Yes [provider]  polyethylene glycol (MIRALAX / GLYCOLAX) packet Take 17 g by mouth daily. Patient taking differently: Take 17 g by mouth 2 (two) times daily.  06/02/14  Yes Loren Racer, MD  SYMBICORT 160-4.5 MCG/ACT inhaler  Inhale 2 puffs into the lungs 2 (two) times daily. *Rinse after use 06/16/16  Yes [provider]  timolol (TIMOPTIC) 0.5 % ophthalmic solution Place 1 drop into both eyes as directed.  06/16/16  Yes [provider]    Inpatient Medications:  . ALPRAZolam  0.25 mg Oral BID  . aspirin EC  81 mg Oral QHS  . enoxaparin (LOVENOX) injection  30 mg Subcutaneous Q24H  . feeding supplement (ENSURE ENLIVE)  237 mL Oral BID BM  . mometasone-formoterol  2 puff Inhalation BID  . polyethylene glycol  17 g Oral BID  . timolol  1 drop Both Eyes Daily     Allergies:  Allergies  Allergen Reactions  . Prednisone     Unknown reaction    Social History   Social History  . Marital status: Widowed    Spouse name: N/A  . Number of children: N/A  . Years of education: N/A   Occupational History  . RETIRED beautician    Social History Main Topics  . Smoking status: Never Smoker  . Smokeless tobacco: Never Used  . Alcohol use No  . Drug use: No  . Sexual activity: Not on file   Other Topics Concern  . Not on file   Social History Narrative  . No narrative on file     Family History  Problem Relation Age of Onset  . Heart failure Father   . Heart failure Sister   . Heart failure Mother   . Stroke Sister   . Cancer Sister   . Alzheimer's disease Brother      Review of Systems: lost 2lb recently, poor appetite All other systems reviewed and are otherwise negative except as noted above.  Labs:  Recent Labs  06/17/16 1621 06/17/16 1958 06/18/16 0221 06/18/16 0800  TROPONINI <0.03 <0.03 <0.03 <0.03   Lab Results  Component Value Date   WBC 9.5 06/17/2016   HGB 12.2 06/17/2016   HCT 38.7 06/17/2016   MCV 91.9 06/17/2016   PLT 211 06/17/2016    Recent Labs Lab 06/17/16 1621  NA 140  K 4.1  CL 98*  CO2 35*  BUN 13  CREATININE 0.45  CALCIUM 9.0  GLUCOSE 118*   No results found for: CHOL, HDL, LDLCALC, TRIG Lab Results  Component Value Date    DDIMER 0.31 06/17/2016    Radiology/Studies:  Dg Chest 2 View  Result Date: 06/17/2016 CLINICAL DATA:  Chest pain. EXAM: CHEST  2 VIEW COMPARISON:  Radiographs of Jun 16, 2016. FINDINGS: Stable cardiomediastinal silhouette. Atherosclerosis of thoracic aorta is noted. Stable biapical scarring and fibrosis is noted. No pneumothorax or significant pleural effusion is noted. Old sternal fracture is noted. Stable bibasilar scarring is noted. No acute pulmonary disease is noted. IMPRESSION: Aortic atherosclerosis. Stable bilateral lung scarring. No acute abnormality is noted. Electronically Signed   By: Lupita Raider, M.D.   On: 06/17/2016 16:57   Dg Chest 2 View  Result Date: 06/16/2016 CLINICAL  DATA:  Chest pain with chronic shortness of breath EXAM: CHEST  2 VIEW COMPARISON:  June 02, 2014 FINDINGS: There is fibrosis in both lung apices, stable. There is no appreciable edema or consolidation. Heart size is within normal limits. There is distortion of pulmonary vascularity in the upper lobes due to new cicatrization in the lung apices. This is a stable finding. There is aortic atherosclerosis. No adenopathy. There is thoracic scoliosis, convex right in the upper thoracic region and convex to the left more inferiorly. IMPRESSION: Apical fibrosis bilaterally. No edema or consolidation. Stable cardiac silhouette. Aortic atherosclerosis. No evident adenopathy. Electronically Signed   By: Bretta BangWilliam  Woodruff III M.D.   On: 06/16/2016 17:32    Wt Readings from Last 3 Encounters:  06/17/16 95 lb 6.4 oz (43.3 kg)  06/16/16 97 lb (44 kg)  06/06/15 94 lb (42.6 kg)    EKG:NSR RBBB, LVH with nospecific ST-T changes  Physical Exam: Blood pressure 130/60, pulse 71, temperature 97.7 F (36.5 C), temperature source Oral, resp. rate 18, height 5' (1.524 m), weight 95 lb 6.4 oz (43.3 kg), SpO2 97 %. Body mass index is 18.63 kg/m. General: Frail elderly WF, in no acute distress. Head: Normocephalic, atraumatic,  sclera non-icteric, no xanthomas, nares are without discharge.  Neck: Negative for carotid bruits. JVD not elevated. Lungs: Fibrotic coarseness at bases without wheezes, rales, or rhonchi. Breathing is unlabored. Heart: RRR with S1 S2. No murmurs, rubs, or gallops appreciated. Abdomen: Soft, non-tender, non-distended with normoactive bowel sounds. No hepatomegaly. No rebound/guarding. No obvious abdominal masses. Msk: Generalized atrophy noted, kyphosis and asymmetry of shoulder spaces possibly indicative of scoliosis Extremities: No clubbing or cyanosis. No edema.  Distal pedal pulses are 2+ and equal bilaterally. Neuro: Alert and oriented X 3. No facial asymmetry. No focal deficit. Moves all extremities spontaneously. Psych:  Responds to questions appropriately with a normal affect.     Assessment and Plan  29F with COPD (secondhand tobacco/beauty shop exposure) with chronic respiratory failure on 3L home O2, glaucoma, prior elevated PASP, mild-mod AI/MR/TR, prior PACs/PVCs/a-tach, hyperlipidemia, mild carotid disease by duplex 2011 (1-50% bilaterally), DNR whom we are asked to see for chest pain.  1. Chest pain - mixed atypical/typical features, no specific association with exertion, possibly increased dyspnea lately but also has COPD. Risk factors include advanced age, family history (sister age 81), HLD, prior secondhand smoke and aortic atherosclerosis noted on CXR. It should be noted however that she is rather frail, walks with a walker and recently fell as well. Workup negative for ischemia thus far. Telemetry does show occasional PVCs and brief run of atrial tach but it's not clear that there is any relationship to her symptoms with this as she has not had any chest pain since admission. Will review further workup and management with MD.  2. Occasional PVCs/atrial tach - she was asymptomatic with this during admission. It could be possible that she is having arrhythmia causing her sx as OP.  Does have history of brief palpitations in the past. Can consider low dose empiric selective BB versus CCB to see if this helps, will review with MD. Add TSH to labs.  3. Pulm HTN - prior elevated PASP on echo. Suspect due to COPD. Does not appear to have any CHF.  4. HLD - traditionally followed by PCP on omega-3 supps.  Signed, Laurann Montanaayna N Dunn PA-C 06/18/2016, 9:58 AM Pager: [phone]  Patient seen and discussed with PA Dunn, I agree with her documentation. 83 yo female with  long history of chest pain with negative stress testing in 2013. History of palpitations with 48hr monitor showing primarily SR, occasional PACs and PVCs, short runs of atach, avg HR 90.   06/2015 echo: LVEF 60-65%, no WMAs, mild to mod AI, mild to mod MR, mild to mod TR, PASP 66 K 4.6 Cr 0.38 Hgb 13.6 Plt 221 D-dimer neg Trop neg x 4  CXR no acute process EKG SR, RBBB, LAFB   Patient admitted with chest pain. There is no objective evidence of ischemia by EKG or enzymes. She is 66 with COPD on home O2. She is fully competent and independent in her ADLs. We discussed risks vs benefits of ischemic testing. She and her family are in favor of stress testing, and would be open to cath if neccesary. I think if stress is low to intermediate risk would treat medically, if high risk then would consider possible cath. Cannot run on treadmill. Hesistant to use dobutamine given her atrial ectoypy and runs of atach. COPD but no active wheezing, I think lexiscan is our best option. Reports significnant nausea during last 2013 lexiscan, will give zofran dose prior. Reasonable to try low dose CCB for her atach if negative workup for ischemia.     Dina Rich MD

## 2016-06-18 NOTE — Care Management Note (Signed)
Case Management Note  Patient Details  Name: Olivia Mccormick MRN: [id number] Date of Birth: [birth year]  Subjective/Objective:                  Pt admitted r/o CP, having stress test today. Pt is from home, lives in an ind living community. She is ind with ADL's. She uses 3LPM oxygne and RW pta. She was planning to enroll in group exercise program pta. Pt has PCP, transportation and insurance with drug coverage. Daughter is at the bedside. No needs communicated.   Action/Plan: Plan for DC home with self care. No CM needs anticipated.   Expected Discharge Date:    06/18/2016              Expected Discharge Plan:  Home/Self Care  In-House Referral:  NA  Discharge planning Services  CM Consult  Post Acute Care Choice:  NA Choice offered to:  NA  Status of Service:  Completed, signed off  Malcolm MetroChildress, Collyns Mcquigg Demske, RN 06/18/2016, 9:41 AM

## 2016-06-19 ENCOUNTER — Ambulatory Visit: Payer: Medicare Other | Admitting: Cardiology

## 2016-06-19 ENCOUNTER — Encounter: Payer: Self-pay | Admitting: Cardiology

## 2016-07-19 NOTE — Progress Notes (Signed)
Cardiology Office Note  Date: 07/20/2016   ID: Olivia ParrKathleen Layton, DOB 1926/01/02, MRN [id number]  PCP: Ignatius SpeckingVyas, Dhruv B, MD  Primary Cardiologist: Nona DellSamuel Nichael Ehly, MD   Chief Complaint  Patient presents with  . Hospitalization Follow-up    History of Present Illness: Olivia Mccormick is an 83 y.o. female last seen in April 2017. [facility] was [facility] with chest pain and ruled out for ACS with normal troponin I levels, was seen in consultation by Dr. Wyline MoodBranch. Telemetry showed some episode of PAT/atrial tachycardia. She underwent a Lexiscan Myoview that was low risk as outlined below and was started on low dose Cardizem with office follow-up arranged.  She is here today with her daughter. States that she feels well, no chest pain or palpitations. She has tolerated the addition of diltiazem. She continues to use oxygen 24 hours a day, follows with Dr. Sherril CroonVyas and Dr. Juanetta GoslingHawkins.  We went over the results of her recent cardiac testing and at this point would continue with observation, no changes made in medication doses.  Past Medical History:  Diagnosis Date  . Allergic rhinitis   . Atrial tachycardia (HCC)   . Carotid artery disease (HCC)    1-50% bilateral ICA stenosis 2011 [facility]  . Chronic respiratory failure (HCC)   . COPD (chronic obstructive pulmonary disease) (HCC)    3L home O2  . History of glaucoma   . Macular degeneration   . Mild aortic insufficiency   . Mild mitral regurgitation   . Mild tricuspid regurgitation   . Palpitations   . Premature atrial contractions   . Pulmonary hypertension (HCC)   . PVC's (premature ventricular contractions)     Past Surgical History:  Procedure Laterality Date  . APPENDECTOMY    . CATARACT EXTRACTION Bilateral     Current Outpatient Prescriptions  Medication Sig Dispense Refill  . albuterol (PROAIR HFA) 108 (90 Base) MCG/ACT inhaler Inhale 2 puffs into the lungs 4 (four) times daily.    Marland Kitchen.  ALPRAZolam (XANAX) 0.25 MG tablet Take 0.25 mg by mouth 2 (two) times daily.     Marland Kitchen. aspirin 81 MG tablet Take 81 mg by mouth at bedtime.     . Calcium-Vitamin D 600-125 MG-UNIT TABS Take 1 tablet by mouth daily.     . Cholecalciferol (VITAMIN D3) 1000 UNITS CAPS Take by mouth daily.      Marland Kitchen. diltiazem (CARDIZEM) 30 MG tablet Take 1 tablet (30 mg total) by mouth 2 (two) times daily. 60 tablet 6  . fish oil-omega-3 fatty acids 1000 MG capsule Take 1 g by mouth daily.      . polyethylene glycol (MIRALAX / GLYCOLAX) packet Take 17 g by mouth 2 (two) times daily.    . SYMBICORT 160-4.5 MCG/ACT inhaler Inhale 2 puffs into the lungs 2 (two) times daily. *Rinse after use    . timolol (TIMOPTIC) 0.5 % ophthalmic solution Place 1 drop into both eyes as directed.      No current facility-administered medications for this visit.    Allergies:  Prednisone   Social History: The patient  reports that she has never smoked. She has never used smokeless tobacco. She reports that she does not drink alcohol or use drugs.   ROS:  Please see the history of present illness. Otherwise, complete review of systems is positive for hearing loss, chronic shortness of breath.  All other systems are reviewed and negative.   Physical Exam: VS:  BP Marland Kitchen(!)  151/73   Pulse 82   Ht 5' (1.524 m)   Wt 98 lb 9.6 oz (44.7 kg)   LMP  (LMP Unknown)   BMI 19.26 kg/m , BMI Body mass index is 19.26 kg/m.  Wt Readings from Last 3 Encounters:  07/20/16 98 lb 9.6 oz (44.7 kg)  06/17/16 95 lb 6.4 oz (43.3 kg)  06/16/16 97 lb (44 kg)    General: Elderly woman, appears comfortable at rest. Wearing oxygen via nasal cannula. HEENT: Conjunctiva and lids normal, oropharynx clear. Neck: Supple, no elevated JVP or carotid bruits, no thyromegaly. Lungs: Diminished breath sounds without wheezing, nonlabored breathing at rest. Cardiac: Regular rate and rhythm, no S3, 2/6 systolic murmur mainly LSB and at apex, no pericardial rub. Abdomen: Soft,  nontender, bowel sounds present, no guarding or rebound. Extremities: No pitting edema, distal pulses 2+. Skin: Warm and dry. Musculoskeletal: Kyphosis noted. Neuropsychiatric: Alert and oriented x3, affect grossly appropriate.  ECG: I personally reviewed the tracing from 06/17/2016 which showed sinus rhythm with LVH, RBBB, LAFB, NSST changes.  Recent Labwork: 06/17/2016: BUN 13; Creatinine, Ser 0.45; Hemoglobin 12.2; Platelets 211; Potassium 4.1; Sodium 140 06/18/2016: TSH 1.085   Other Studies Reviewed Today:  Echocardiogram 06/12/2015: Study Conclusions  - Left ventricle: The cavity size was normal. There was focal basal   hypertrophy. Systolic function was normal. The estimated ejection   fraction was in the range of 60% to 65%. Wall motion was normal;   there were no regional wall motion abnormalities. The study is   not technically sufficient to allow evaluation of LV diastolic   function. - Aortic valve: Mildly to moderately calcified annulus. Trileaflet;   normal thickness leaflets. There was mild to moderate   regurgitation. Valve area (VTI): 1.93 cm^2. Valve area (Vmax):   1.9 cm^2. Valve area (Vmean): 2.04 cm^2. Regurgitation pressure   half-time: 514 ms. - Mitral valve: Mildly calcified annulus. Mildly thickened leaflets   . There was mild to moderate regurgitation. The MR vena contracta   is 0.4cm. - Right ventricle: The cavity size was mildly dilated. - Right atrium: The atrium was mildly dilated. - Tricuspid valve: There was mild-moderate regurgitation. - Pulmonary arteries: Systolic pressure was moderately increased.   PA peak pressure: 66 mm Hg (S).  Lexiscan Myoview 06/18/2016:  No diagnostic ST segment changes to indicate ischemia.  Small, mild intensity, mid to apical inferior defect that is fixed and more prominent on rest imaging suggesting attenuation artifact. No large ischemic territories noted.  This is a low risk study.  Nuclear stress EF:  82%.  Assessment and Plan:  1. Paroxysmal atrial tachycardia, symptomatically stable on low-dose Cardizem which will continue for now.  2. History of chest pain, question related to problem #1. Recent Myoview was overall low risk and consistent with attenuation rather than scar or ischemia. LVEF normal range.  3. Oxygen-dependent COPD with severe pulmonary hypertension. Keep follow-up with Dr. Juanetta Gosling.  4. Hyperlipidemia, continues on omega-3 supplements.  Current medicines were reviewed with the patient today.  Disposition: Follow-up in 6 months.  Signed, Jonelle Sidle, MD, [facility] 07/20/2016 1:23 PM    [facility] [address] [place], [place], [place] [address] Phone: [phone]; Fax: [phone]

## 2016-07-20 ENCOUNTER — Encounter: Payer: Self-pay | Admitting: Cardiology

## 2016-07-20 ENCOUNTER — Ambulatory Visit (INDEPENDENT_AMBULATORY_CARE_PROVIDER_SITE_OTHER): Payer: Medicare Other | Admitting: Cardiology

## 2016-07-20 VITALS — BP 151/73 | HR 82 | Ht 60.0 in | Wt 98.6 lb

## 2016-07-20 DIAGNOSIS — R072 Precordial pain: Secondary | ICD-10-CM | POA: Diagnosis not present

## 2016-07-20 DIAGNOSIS — J449 Chronic obstructive pulmonary disease, unspecified: Secondary | ICD-10-CM | POA: Diagnosis not present

## 2016-07-20 DIAGNOSIS — I471 Supraventricular tachycardia: Secondary | ICD-10-CM

## 2016-07-20 DIAGNOSIS — E782 Mixed hyperlipidemia: Secondary | ICD-10-CM

## 2016-07-20 MED ORDER — DILTIAZEM HCL 30 MG PO TABS: 30.0000 mg | ORAL_TABLET | Freq: Two times a day (BID) | ORAL | 6 refills | Status: DC

## 2016-07-20 NOTE — Patient Instructions (Signed)

## 2016-08-04 ENCOUNTER — Other Ambulatory Visit: Payer: Self-pay | Admitting: Physician Assistant

## 2016-11-18 ENCOUNTER — Other Ambulatory Visit: Payer: Self-pay | Admitting: Cardiology

## 2017-01-19 ENCOUNTER — Ambulatory Visit: Payer: Medicare Other | Admitting: Cardiology

## 2017-02-24 ENCOUNTER — Encounter: Payer: Self-pay | Admitting: *Deleted

## 2017-04-09 ENCOUNTER — Encounter: Payer: Self-pay | Admitting: Cardiology

## 2017-04-09 ENCOUNTER — Ambulatory Visit: Payer: Medicare Other | Admitting: Cardiology

## 2017-04-09 VITALS — BP 130/70 | HR 79 | Ht 60.0 in | Wt 101.6 lb

## 2017-04-09 DIAGNOSIS — E782 Mixed hyperlipidemia: Secondary | ICD-10-CM | POA: Diagnosis not present

## 2017-04-09 DIAGNOSIS — J449 Chronic obstructive pulmonary disease, unspecified: Secondary | ICD-10-CM

## 2017-04-09 DIAGNOSIS — I471 Supraventricular tachycardia: Secondary | ICD-10-CM | POA: Diagnosis not present

## 2017-04-09 MED ORDER — DILTIAZEM HCL 30 MG PO TABS: 30.0000 mg | ORAL_TABLET | Freq: Two times a day (BID) | ORAL | 6 refills | Status: DC

## 2017-04-09 NOTE — Patient Instructions (Signed)

## 2017-04-09 NOTE — Progress Notes (Signed)
Cardiology Office Note  Date: 04/09/2017   ID: Olivia Mccormick, DOB Dec 15, 1925, MRN [id number]  PCP: Ignatius Specking, MD  Primary Cardiologist: Nona Dell, MD   Chief Complaint  Patient presents with  . Atrial tachycardia    History of Present Illness: Olivia Mccormick is an 84 y.o. female last seen in June 2018.  She is here today for a follow-up visit.  From a cardiac perspective she has had no progressive palpitations or syncope.  She has been hospitalized at [facility] in the interim with COPD exacerbation and hypoxic/hypercarbic respiratory failure.  She is on oxygen supplementation and also BiPAP at nighttime.  Follow-up tracing from 01/18/2017 showed sinus rhythm with left atrial enlargement, right bundle branch block, and left anterior fascicular block.  Current cardiac regimen includes aspirin and short acting Cardizem.  Past Medical History:  Diagnosis Date  . Allergic rhinitis   . Atrial tachycardia (HCC)   . Carotid artery disease (HCC)    1-50% bilateral ICA stenosis 2011 [facility]  . Chronic respiratory failure (HCC)   . COPD (chronic obstructive pulmonary disease) (HCC)    3L home O2  . History of glaucoma   . Macular degeneration   . Mild aortic insufficiency   . Mild mitral regurgitation   . Mild tricuspid regurgitation   . Palpitations   . Premature atrial contractions   . Pulmonary hypertension (HCC)   . PVC's (premature ventricular contractions)     Past Surgical History:  Procedure Laterality Date  . APPENDECTOMY    . CATARACT EXTRACTION Bilateral     Current Outpatient Medications  Medication Sig Dispense Refill  . albuterol (PROAIR HFA) 108 (90 Base) MCG/ACT inhaler Inhale 2 puffs into the lungs 4 (four) times daily.    Marland Kitchen ALPRAZolam (XANAX) 0.25 MG tablet Take 0.25 mg by mouth 2 (two) times daily.     Marland Kitchen aspirin 81 MG tablet Take 81 mg by mouth at bedtime.     . brimonidine (ALPHAGAN P) 0.1 % SOLN Place 1  drop into both eyes 2 (two) times daily.    . Calcium-Vitamin D 600-125 MG-UNIT TABS Take 1 tablet by mouth daily.     . Cholecalciferol (VITAMIN D3) 1000 UNITS CAPS Take by mouth daily.      Marland Kitchen diltiazem (CARDIZEM) 30 MG tablet Take 1 tablet (30 mg total) by mouth 2 (two) times daily. 60 tablet 6  . dorzolamide (TRUSOPT) 2 % ophthalmic solution Place 1 drop into both eyes 2 (two) times daily.     . fish oil-omega-3 fatty acids 1000 MG capsule Take 1 g by mouth daily.      . Fluticasone-Umeclidin-Vilant (TRELEGY ELLIPTA) 100-62.5-25 MCG/INH AEPB Inhale 1 puff into the lungs daily.    Marland Kitchen ibuprofen (ADVIL,MOTRIN) 200 MG tablet Take 200 mg by mouth 2 (two) times daily.    . Melatonin 5 MG TABS Take 1 tablet by mouth at bedtime.    Marland Kitchen escitalopram (LEXAPRO) 5 MG tablet Take 5 mg by mouth daily as needed.   12   No current facility-administered medications for this visit.    Allergies:  Prednisone   Social History: The patient  reports that  has never smoked. she has never used smokeless tobacco. She reports that she does not drink alcohol or use drugs.   ROS:  Please see the history of present illness. Otherwise, complete review of systems is positive for hearing loss.  All other systems are reviewed and negative.  Physical Exam: VS:  BP 130/70   Pulse 79   Ht 5' (1.524 m)   Wt 101 lb 9.6 oz (46.1 kg)   LMP  (LMP Unknown)   SpO2 91% Comment: 4 L/min  BMI 19.84 kg/m , BMI Body mass index is 19.84 kg/m.  Wt Readings from Last 3 Encounters:  04/09/17 101 lb 9.6 oz (46.1 kg)  07/20/16 98 lb 9.6 oz (44.7 kg)  06/17/16 95 lb 6.4 oz (43.3 kg)    General: Frail-appearing elderly woman, wearing oxygen via nasal cannula.  HEENT: Conjunctiva and lids normal, oropharynx clear. Neck: Supple, no elevated JVP or carotid bruits, no thyromegaly. Lungs: Diminished breath sounds, nonlabored breathing at rest. Cardiac: Regular rate and rhythm, no S3, 2/6 systolic murmur. Abdomen: Soft, nontender, bowel  sounds present. Extremities: No pitting edema, distal pulses 2+. Skin: Warm and dry. Musculoskeletal: Kyphosis noted. Neuropsychiatric: Alert and oriented x3, affect grossly appropriate.  ECG: I personally reviewed the tracing from 06/17/2016 which showed sinus rhythm with right bundle branch block and left anterior fascicular block, increased voltage.  Recent Labwork: 06/17/2016: BUN 13; Creatinine, Ser 0.45; Hemoglobin 12.2; Platelets 211; Potassium 4.1; Sodium 140 06/18/2016: TSH 1.085  December 2018: BUN 14, creatinine 0.3, potassium 3.9, AST 16, ALT 13, hemoglobin 10.7, platelets 165   Other Studies Reviewed Today:  Lexiscan Myoview 06/18/2016:  No diagnostic ST segment changes to indicate ischemia.  Small, mild intensity, mid to apical inferior defect that is fixed and more prominent on rest imaging suggesting attenuation artifact. No large ischemic territories noted.  This is a low risk study.  Nuclear stress EF: 82%  Echocardiogram 06/12/2015: Study Conclusions  - Left ventricle: The cavity size was normal. There was focal basal hypertrophy. Systolic function was normal. The estimated ejection fraction was in the range of 60% to 65%. Wall motion was normal; there were no regional wall motion abnormalities. The study is not technically sufficient to allow evaluation of LV diastolic function. - Aortic valve: Mildly to moderately calcified annulus. Trileaflet; normal thickness leaflets. There was mild to moderate regurgitation. Valve area (VTI): 1.93 cm^2. Valve area (Vmax): 1.9 cm^2. Valve area (Vmean): 2.04 cm^2. Regurgitation pressure half-time: 514 ms. - Mitral valve: Mildly calcified annulus. Mildly thickened leaflets . There was mild to moderate regurgitation. The MR vena contracta is 0.4cm. - Right ventricle: The cavity size was mildly dilated. - Right atrium: The atrium was mildly dilated. - Tricuspid valve: There was mild-moderate  regurgitation. - Pulmonary arteries: Systolic pressure was moderately increased. PA peak pressure: 66 mm Hg (S).  Assessment and Plan:  1.  Paroxysmal atrial tachycardia.  She is doing well on low-dose Cardizem.  Continue observation.  2.  History of chest pain in the setting of tachycardia, no recent symptoms.  Myoview study from last year was low risk.  3.  Severe, oxygen dependent COPD with chronic respiratory failure, hypercarbic and hypoxic.  4.  Hyperlipidemia, on omega-3 supplements.  Current medicines were reviewed with the patient today.  Disposition: Follow-up in 6 months.  Signed, Jonelle SidleSamuel G. Makari Portman, MD, [facility] 04/09/2017 3:10 PM    [facility] [address] [place], [place], [place] [address] Phone: [phone]; Fax: [phone]

## 2017-12-13 ENCOUNTER — Other Ambulatory Visit: Payer: Self-pay | Admitting: Cardiology

## 2017-12-23 NOTE — Progress Notes (Signed)
Cardiology Office Note  Date: 12/24/2017   ID: Olivia Mccormick, DOB Sep 07, 1925, MRN [id number]  PCP: Ignatius SpeckingVyas, Dhruv B, MD  Primary Cardiologist: Nona DellSamuel , MD   Chief Complaint  Patient presents with  . Atrial tachycardia    History of Present Illness: Olivia Mccormick is a 84 y.o. female last seen in March.  She presents with family member for a routine visit.  She reports only occasional brief palpitations, none prolonged.  She states that she is getting over an episode of chest congestion, has COPD with chronic hypoxic/hypercarbic respiratory failure at baseline on supplemental oxygen followed by Dr. Sherril CroonVyas.  I reviewed her ECG today which shows sinus rhythm with PACs, right bundle branch block, left anterior fascicular block, and increased voltage with repolarization changes.  She continues on low-dose aspirin as well as short acting Cardizem as before.  Ports compliance and no obvious intolerances.  Past Medical History:  Diagnosis Date  . Allergic rhinitis   . Atrial tachycardia (HCC)   . Carotid artery disease (HCC)    1-50% bilateral ICA stenosis 2011 [facility]  . Chronic respiratory failure (HCC)   . COPD (chronic obstructive pulmonary disease) (HCC)    3L home O2  . History of glaucoma   . Macular degeneration   . Mild aortic insufficiency   . Mild mitral regurgitation   . Mild tricuspid regurgitation   . Palpitations   . Premature atrial contractions   . Pulmonary hypertension (HCC)   . PVC's (premature ventricular contractions)     Past Surgical History:  Procedure Laterality Date  . APPENDECTOMY    . CATARACT EXTRACTION Bilateral     Current Outpatient Medications  Medication Sig Dispense Refill  . albuterol (PROAIR HFA) 108 (90 Base) MCG/ACT inhaler Inhale 2 puffs into the lungs 4 (four) times daily.    Marland Kitchen. ALPRAZolam (XANAX) 0.25 MG tablet Take 0.25 mg by mouth 2 (two) times daily.     Marland Kitchen. amoxicillin (AMOXIL) 250 MG capsule Take 250 mg  by mouth 3 (three) times daily.    Marland Kitchen. aspirin 81 MG tablet Take 81 mg by mouth at bedtime.     . brimonidine (ALPHAGAN P) 0.1 % SOLN Place 1 drop into both eyes 2 (two) times daily.    . Calcium-Vitamin D 600-125 MG-UNIT TABS Take 1 tablet by mouth daily.     . Cholecalciferol (VITAMIN D3) 1000 UNITS CAPS Take by mouth daily.      Marland Kitchen. diltiazem (CARDIZEM) 30 MG tablet TAKE 1 TABLET BY MOUTH TWICE DAILY 60 tablet 6  . dorzolamide (TRUSOPT) 2 % ophthalmic solution Place 1 drop into both eyes 2 (two) times daily.     Marland Kitchen. escitalopram (LEXAPRO) 5 MG tablet Take 5 mg by mouth daily as needed.   12  . fish oil-omega-3 fatty acids 1000 MG capsule Take 1 g by mouth daily.      . Fluticasone-Umeclidin-Vilant (TRELEGY ELLIPTA) 100-62.5-25 MCG/INH AEPB Inhale 1 puff into the lungs daily.    Marland Kitchen. ibuprofen (ADVIL,MOTRIN) 200 MG tablet Take 200 mg by mouth 2 (two) times daily.    . Melatonin 5 MG TABS Take 1 tablet by mouth at bedtime.     No current facility-administered medications for this visit.    Allergies:  Prednisone   Social History: The patient  reports that she has never smoked. She has never used smokeless tobacco. She reports that she does not drink alcohol or use drugs.   ROS:  Please see the  history of present illness. Otherwise, complete review of systems is positive for hearing loss.  All other systems are reviewed and negative.   Physical Exam: VS:  BP 140/72   Pulse 98   Ht 4\' 8"  (1.422 m)   Wt 94 lb (42.6 kg)   LMP  (LMP Unknown)   SpO2 90%   BMI 21.07 kg/m , BMI Body mass index is 21.07 kg/m.  Wt Readings from Last 3 Encounters:  12/24/17 94 lb (42.6 kg)  04/09/17 101 lb 9.6 oz (46.1 kg)  07/20/16 98 lb 9.6 oz (44.7 kg)    General: Frail appearing elderly woman, wearing oxygen via nasal cannula. HEENT: Conjunctiva and lids normal, oropharynx clear. Neck: Supple, no elevated JVP or carotid bruits, no thyromegaly. Lungs: Decreased breath sounds without wheezing, nonlabored  breathing at rest. Cardiac: Regular rate and rhythm with ectopy, no S3, 2/6 systolic murmur. Abdomen: Soft, nontender, bowel sounds present. Extremities: No pitting edema, distal pulses 2+. Skin: Warm and dry. Musculoskeletal: Kyphosis present. Neuropsychiatric: Alert and oriented x3, affect grossly appropriate.  ECG: I personally reviewed the tracing from 02/05/2017 which showed sinus rhythm with left atrial enlargement, right bundle branch block, left anterior fascicular block.  Recent Labwork:  December 2018: BUN 14, creatinine 0.3, potassium 3.9, AST 16, ALT 13, hemoglobin 10.7, platelets 165   Other Studies Reviewed Today:  Lexiscan Myoview 06/18/2016:  No diagnostic ST segment changes to indicate ischemia.  Small, mild intensity, mid to apical inferior defect that is fixed and more prominent on rest imaging suggesting attenuation artifact. No large ischemic territories noted.  This is a low risk study.  Nuclear stress EF: 82%  Echocardiogram 06/12/2015: Study Conclusions  - Left ventricle: The cavity size was normal. There was focal basal hypertrophy. Systolic function was normal. The estimated ejection fraction was in the range of 60% to 65%. Wall motion was normal; there were no regional wall motion abnormalities. The study is not technically sufficient to allow evaluation of LV diastolic function. - Aortic valve: Mildly to moderately calcified annulus. Trileaflet; normal thickness leaflets. There was mild to moderate regurgitation. Valve area (VTI): 1.93 cm^2. Valve area (Vmax): 1.9 cm^2. Valve area (Vmean): 2.04 cm^2. Regurgitation pressure half-time: 514 ms. - Mitral valve: Mildly calcified annulus. Mildly thickened leaflets . There was mild to moderate regurgitation. The MR vena contracta is 0.4cm. - Right ventricle: The cavity size was mildly dilated. - Right atrium: The atrium was mildly dilated. - Tricuspid valve: There was  mild-moderate regurgitation. - Pulmonary arteries: Systolic pressure was moderately increased. PA peak pressure: 66 mm Hg (S).  Assessment and Plan:  1.  Paroxysmal atrial tachycardia.  Reasonably well controlled on low-dose Cardizem.  No changes were made today.  ECG reviewed.  2.  Severe, oxygen dependent COPD with chronic hypoxic/hypercarbic respiratory failure.  She follows with Dr. Sherril Croon.  Current medicines were reviewed with the patient today.   Orders Placed This Encounter  Procedures  . EKG 12-Lead     Disposition: Follow-up in 6 months.  Signed, Jonelle Sidle, MD, [facility] 12/24/2017 3:46 PM    [facility] at [facility] [address] [place], [place], [place] [address] Phone: [phone]; Fax: [phone]

## 2017-12-24 ENCOUNTER — Encounter: Payer: Self-pay | Admitting: Cardiology

## 2017-12-24 ENCOUNTER — Ambulatory Visit: Payer: Medicare Other | Admitting: Cardiology

## 2017-12-24 VITALS — BP 140/72 | HR 98 | Ht <= 58 in | Wt 94.0 lb

## 2017-12-24 DIAGNOSIS — J449 Chronic obstructive pulmonary disease, unspecified: Secondary | ICD-10-CM

## 2017-12-24 DIAGNOSIS — I471 Supraventricular tachycardia: Secondary | ICD-10-CM | POA: Diagnosis not present

## 2017-12-24 NOTE — Patient Instructions (Addendum)

## 2018-03-27 IMAGING — NM NM MYOCAR MULTI W/SPECT W/WALL MOTION & EF
2 series · 12 of 12 positions shown · non-contrast
Comparison: none

[Series 1: rest · 6.51mm/px · 6 of 64 frames shown]
[frame 6/64]
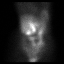
[frame 16/64]
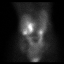
[frame 27/64]
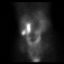
[frame 38/64]
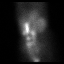
[frame 48/64]
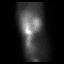
[frame 59/64]
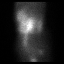

[Series 2: stress gated · 6.51mm/px · 6 of 64 frames shown]
[frame 6/64]
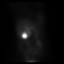
[frame 16/64]
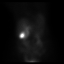
[frame 27/64]
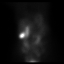
[frame 38/64]
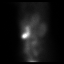
[frame 48/64]
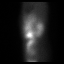
[frame 59/64]
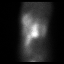

[12 of 12 positions shown; findings below may reference images not displayed]

Canned report from images found in remote index.

Refer to host system for actual result text.

## 2018-06-24 ENCOUNTER — Ambulatory Visit: Payer: Medicare Other | Admitting: Cardiology

## 2018-07-12 ENCOUNTER — Other Ambulatory Visit: Payer: Self-pay | Admitting: Cardiology

## 2018-07-21 ENCOUNTER — Encounter: Payer: Self-pay | Admitting: *Deleted

## 2018-07-21 ENCOUNTER — Telehealth: Payer: Self-pay | Admitting: Cardiology

## 2018-07-21 NOTE — Telephone Encounter (Signed)
Spoke with daughter and she informed me that her mother was in [facility] Day. Was told that she had blockages and needed to be seen By Cardiologist.   Daughter is asking that her mother be seen in office at scheduled time but she lives at [place]. Please contact daughter and advise if she needs to be Virtual or can be seen in office.

## 2018-07-21 NOTE — Telephone Encounter (Signed)
Contact daughter Tye [place] at [phone]

## 2018-07-21 NOTE — Telephone Encounter (Signed)
Daughter says that PCP wants patient to have a cardiac test r/t recent admission to [facility] for heart block. Daughter request that patient be seen in the office. Explained to daughter that d/t the pandemic, and status US living in ALF, at this time, patient's visit would have to be a VV. Also advised that records have requested from [facility] and PCP and provider would be able to order any needed testing at [facility]. Daughter agreed to VV and says that her brother would be available for VV with patient.   Patient's daughter [facility]) verbally consented for telehealth visits with [facility] and understands that her insurance company will be billed for the encounter.

## 2018-07-22 ENCOUNTER — Telehealth: Payer: Self-pay | Admitting: Cardiology

## 2018-07-22 NOTE — Telephone Encounter (Signed)
Pt daughter says pt took diltiazem at 8am took a nap at 1pm woke up at 2pm thinking it was later than it was and took another diltiazem (usually takes second dose at 6pm) wanted to know if this would cause any symptoms - explained this should not cause any problems as this is short acting and it was several hours between doses. Daughter aware and will monitor pt symptoms and HR/BP and contact us if symptoms occur

## 2018-07-22 NOTE — Telephone Encounter (Signed)
Daughter Juliann Pulse called stating that patient went to see and woke up. She took her night time dosage of Diltiazem. Wanting to know if this is going to hurt her.  Please call [phone] Juliann Pulse)

## 2018-07-25 NOTE — Progress Notes (Signed)
Virtual Visit via Telephone Note   This visit type was conducted due to national recommendations for restrictions regarding the COVID-19 Pandemic (e.g. social distancing) in an effort to limit this patient's exposure and mitigate transmission in our community.  Due to her co-morbid illnesses, this patient is at least at moderate risk for complications without adequate follow up.  This format is felt to be most appropriate for this patient at this time.  The patient did not have access to video technology/had technical difficulties with video requiring transitioning to audio format only (telephone).  All issues noted in this document were discussed and addressed.  No physical exam could be performed with this format.  Please refer to the patient's chart for her  consent [facility].   Date:  07/26/2018   ID:  Olivia Mccormick, DOB November 03, 1925, MRN [id number]  Patient Location: Home Provider Location: Office  PCP:  Ignatius SpeckingVyas, Dhruv B, MD  Cardiologist:  Nona DellSamuel Bemnet Trovato, MD Electrophysiologist:  None   Evaluation Performed:  Follow-Up Visit  Chief Complaint:   Cardiac follow-up  History of Present Illness:    Olivia Mccormick is a 85 y.o. female last seen in November 2019.  I reviewed her recent records, [facility], admitted from nursing home unresponsive with agonal respirations and found to be in complete heart block.  Patient was DNR and per discussion with family and cardiologist Dr. Andrey Farmerossi [facility]) pacemaker was not pursued.  Ultimately however, she reportedly converted back to normal conduction and was discharged.  I reviewed her medications per H&P which included Cardizem 30 mg twice daily and timolol eyedrops.  It is not clear to me whether these were discontinued or not at discharge.  She did not have video access and we spoke by phone today.  I also spoke with her son Olivia Mccormick present.  She has had no reported issues since hospital  discharge.  In review of her medications, she has still been taking Cardizem, I asked them to stop this medicine completely for now.  She is not on timolol eyedrops.  She has had no sudden dizziness or syncope.  Patient's son indicated that he still was not enthusiastic about her having a pacemaker.  The patient does not have symptoms concerning for COVID-19 infection (fever, chills, cough, or new shortness of breath).    Past Medical History:  Diagnosis Date  . Allergic rhinitis   . Atrial tachycardia (HCC)   . Carotid artery disease (HCC)    1-50% bilateral ICA stenosis 2011 [facility]  . Chronic respiratory failure (HCC)   . COPD (chronic obstructive pulmonary disease) (HCC)    3L home O2  . History of glaucoma   . Macular degeneration   . Mild aortic insufficiency   . Mild mitral regurgitation   . Mild tricuspid regurgitation   . Palpitations   . Premature atrial contractions   . Pulmonary hypertension (HCC)   . PVC's (premature ventricular contractions)    Past Surgical History:  Procedure Laterality Date  . APPENDECTOMY    . CATARACT EXTRACTION Bilateral      Current Meds  Medication Sig  . albuterol (PROAIR HFA) 108 (90 Base) MCG/ACT inhaler Inhale 2 puffs into the lungs 4 (four) times daily.  Marland Kitchen. ALPRAZolam (XANAX) 0.25 MG tablet Take 0.25 mg by mouth 2 (two) times daily.   Marland Kitchen. aspirin 81 MG tablet Take 81 mg by mouth at bedtime.   . brimonidine (ALPHAGAN P) 0.1 %  SOLN Place 1 drop into both eyes 2 (two) times daily.  . Calcium-Vitamin D 600-125 MG-UNIT TABS Take 1 tablet by mouth daily.   . Cholecalciferol (VITAMIN D3) 1000 UNITS CAPS Take by mouth daily.    . dorzolamide (TRUSOPT) 2 % ophthalmic solution Place 1 drop into both eyes 2 (two) times daily.   Marland Kitchen escitalopram (LEXAPRO) 5 MG tablet Take 5 mg by mouth daily as needed.   . Fluticasone-Umeclidin-Vilant (TRELEGY ELLIPTA) 100-62.5-25 MCG/INH AEPB Inhale 1 puff into the lungs daily.  Marland Kitchen ibuprofen  (ADVIL,MOTRIN) 200 MG tablet Take 400 mg by mouth 2 (two) times daily.   . [DISCONTINUED] diltiazem (CARDIZEM) 30 MG tablet TAKE 1 TABLET BY MOUTH TWICE DAILY     Allergies:   Prednisone   Social History   Tobacco Use  . Smoking status: Never Smoker  . Smokeless tobacco: Never Used  Substance Use Topics  . Alcohol use: No  . Drug use: No     Family Hx: The patient's family history includes Alzheimer's disease in her brother; Cancer in her sister; Heart attack (age of onset: 71) in her sister; Heart failure in her father, mother, and sister; Stroke in her sister.  ROS:   Please see the history of present illness. Chronic hearing loss. All other systems reviewed and are negative.   Prior CV studies:   The following studies were reviewed today:  Lexiscan Myoview 06/18/2016:  No diagnostic ST segment changes to indicate ischemia.  Small, mild intensity, mid to apical inferior defect that is fixed and more prominent on rest imaging suggesting attenuation artifact. No large ischemic territories noted.  This is a low risk study.  Nuclear stress EF: 82%  Echocardiogram 06/12/2015: Study Conclusions  - Left ventricle: The cavity size was normal. There was focal basal hypertrophy. Systolic function was normal. The estimated ejection fraction was in the range of 60% to 65%. Wall motion was normal; there were no regional wall motion abnormalities. The study is not technically sufficient to allow evaluation of LV diastolic function. - Aortic valve: Mildly to moderately calcified annulus. Trileaflet; normal thickness leaflets. There was mild to moderate regurgitation. Valve area (VTI): 1.93 cm^2. Valve area (Vmax): 1.9 cm^2. Valve area (Vmean): 2.04 cm^2. Regurgitation pressure half-time: 514 ms. - Mitral valve: Mildly calcified annulus. Mildly thickened leaflets . There was mild to moderate regurgitation. The MR vena contracta is 0.4cm. - Right  ventricle: The cavity size was mildly dilated. - Right atrium: The atrium was mildly dilated. - Tricuspid valve: There was mild-moderate regurgitation. - Pulmonary arteries: Systolic pressure was moderately increased. PA peak pressure: 66 mm Hg (S).  Labs/Other Tests and Data Reviewed:    EKG:  An ECG dated 07/04/2018 was personally reviewed today and demonstrated:  Sinus rhythm with complete heart block, likely ventricular escape with left bundle branch block and PVC.  Recent Labs:  May 2020: AST 44, ALT 34, BUN 27, creatinine 0.49, hemoglobin 12.1, platelets 257, troponin T peak of 0.03  Wt Readings from Last 3 Encounters:  12/24/17 94 lb (42.6 kg)  04/09/17 101 lb 9.6 oz (46.1 kg)  07/20/16 98 lb 9.6 oz (44.7 kg)     Objective:    Vital Signs:  Ht 4\' 8"  (1.422 m)   LMP  (LMP Unknown)   BMI 21.07 kg/m    Unable to get blood pressure and heart rate measurement today.  Oxygen saturation 97% and weight 89 pounds. Patient spoke in full sentences on the phone.  Not obviously short of  breath. Hearing loss is evident.  ASSESSMENT & PLAN:    1.  Recent episode of complete heart block as outlined above.  Pacemaker was declined per review of records and discussion with patient/family.  I have recommended that she stop Cardizem completely.  She is not on timolol eyedrops either.  2.  History of a atrial tachycardia, reason for Cardizem previously.  She is at risk for tachycardia-bradycardia syndrome.  3.  Severe, oxygen dependent COPD with chronic hypoxic/hypercarbic respiratory failure.  COVID-19 Education: The signs and symptoms of COVID-19 were discussed with the patient and how to seek care for testing (follow up with PCP or arrange E-visit).  The importance of social distancing was discussed today.  Time:   Today, I have spent 8 minutes with the patient with telehealth technology discussing the above problems.     Medication Adjustments/Labs and Tests Ordered: Current  medicines are reviewed at length with the patient today.  Concerns regarding medicines are outlined above.   Tests Ordered: No orders of the defined types were placed in this encounter.   Medication Changes: No orders of the defined types were placed in this encounter.   Follow Up:  In Person 3 months in the [place] office.  Signed, Nona DellSamuel Biridiana Twardowski, MD  07/26/2018 10:30 AM    [facility]

## 2018-07-26 ENCOUNTER — Encounter: Payer: Self-pay | Admitting: Cardiology

## 2018-07-26 ENCOUNTER — Telehealth (INDEPENDENT_AMBULATORY_CARE_PROVIDER_SITE_OTHER): Payer: Medicare Other | Admitting: Cardiology

## 2018-07-26 VITALS — Ht <= 58 in

## 2018-07-26 DIAGNOSIS — I442 Atrioventricular block, complete: Secondary | ICD-10-CM

## 2018-07-26 DIAGNOSIS — Z7189 Other specified counseling: Secondary | ICD-10-CM

## 2018-07-26 DIAGNOSIS — I471 Supraventricular tachycardia: Secondary | ICD-10-CM

## 2018-07-26 DIAGNOSIS — J449 Chronic obstructive pulmonary disease, unspecified: Secondary | ICD-10-CM

## 2018-07-26 NOTE — Patient Instructions (Addendum)
Medication Instructions:   Your physician has recommended you make the following change in your medication:   Stop diltiazem  Continue all other medications the same  Labwork:  NONE  Testing/Procedures:  NONE  Follow-Up:  Your physician recommends that you schedule a follow-up appointment in: 3 months.  Any Other Special Instructions Will Be Listed Below (If Applicable).  If you need a refill on your cardiac medications before your next appointment, please call your pharmacy.

## 2018-10-26 ENCOUNTER — Ambulatory Visit: Payer: Medicare Other | Admitting: Cardiology

## 2018-10-27 ENCOUNTER — Telehealth: Payer: Self-pay | Admitting: Cardiology

## 2018-10-27 NOTE — Telephone Encounter (Signed)
Virtual Visit Pre-Appointment Phone Call  "(Name), I am calling you today to discuss your upcoming appointment. We are currently trying to limit exposure to the virus that causes COVID-19 by seeing patients at home rather than in the office."  1. "What is the BEST phone number to call the day of the visit?" [phone] 2.   3. Do you have or have access to (through a family member/friend) a smartphone with video capability that we can use for your visit?" a. If yes - list this number in appt notes as cell (if different from BEST phone #) and list the appointment type as a VIDEO visit in appointment notes b. If no - list the appointment type as a PHONE visit in appointment notes  4. Confirm consent - "In the setting of the current Covid19 crisis, you are scheduled for a (phone or video) visit with your provider on (date) at (time).  Just as we do with many in-office visits, in order for you to participate in this visit, we must obtain consent.  If you'd like, I can send this to your mychart (if signed up) or email for you to review.  Otherwise, I can obtain your verbal consent now.  All virtual visits are billed to your insurance company just like a normal visit would be.  By agreeing to a virtual visit, we'd like you to understand that the technology does not allow for your provider to perform an examination, and thus may limit your provider's ability to fully assess your condition. If your provider identifies any concerns that need to be evaluated in person, we will make arrangements to do so.  Finally, though the technology is pretty good, we cannot assure that it will always work on either your or our end, and in the setting of a video visit, we may have to convert it to a phone-only visit.  In either situation, we cannot ensure that we have a secure connection.  Are you willing to proceed?" STAFF: Did the patient verbally acknowledge consent to telehealth visit? Document YES/NO here: YES    5. Advise patient to be prepared - "Two hours prior to your appointment, go ahead and check your blood pressure, pulse, oxygen saturation, and your weight (if you have the equipment to check those) and write them all down. When your visit starts, your provider will ask you for this information. If you have an Apple Watch or Kardia device, please plan to have heart rate information ready on the day of your appointment. Please have a pen and paper handy nearby the day of the visit as well."  6. Give patient instructions for MyChart download to smartphone OR Doximity/[email or web address] as below if video visit (depending on what platform provider is using)  7. Inform patient they will receive a phone call 15 minutes prior to their appointment time (may be from unknown caller ID) so they should be prepared to answer    TELEPHONE CALL NOTE  Olivia Mccormick has been deemed a candidate for a follow-up tele-health visit to limit community exposure during the Covid-19 pandemic. I spoke with the patient via phone to ensure availability of phone/video source, confirm preferred email & phone number, and discuss instructions and expectations.  I reminded Olivia Mccormick to be prepared with any vital sign and/or heart rhythm information that could potentially be obtained via home monitoring, at the time of her visit. I reminded Olivia Mccormick to expect a phone call prior to her visit.  Olivia Mccormick 10/27/2018 10:49 AM   INSTRUCTIONS FOR DOWNLOADING THE MYCHART APP TO SMARTPHONE  - The patient must first make sure to have activated MyChart and know their login information - If Apple, go to [employer] Store and type in MyChart in the search bar and download the app. If Android, ask patient to go to [employer] Play Store and type in [place] in the search bar and download the app. The app is free but as with any other app downloads, their phone may require them to verify saved payment information or Apple/Android password.  -  The patient will need to then log into the app with their MyChart username and password, and select [place] as their healthcare provider to link the account. When it is time for your visit, go to the MyChart app, find appointments, and click Begin Video Visit. Be sure to Select Allow for your device to access the Microphone and Camera for your visit. You will then be connected, and your provider will be with you shortly.  **If they have any issues connecting, or need assistance please contact MyChart service desk [phone])**  **If using a computer, in order to ensure the best quality for their visit they will need to use either of the following Internet Browsers: [employer] Edge, or Google Chrome**  IF USING DOXIMITY or DOXY.ME - The patient will receive a link just prior to their visit by text.     FULL LENGTH CONSENT FOR TELE-HEALTH VISIT   I hereby voluntarily request, consent and authorize [facility] and its employed or contracted physicians, physician assistants, nurse practitioners or other licensed health care professionals (the Practitioner), to provide me with telemedicine health care services (the Services") as deemed necessary by the treating Practitioner. I acknowledge and consent to receive the Services by the Practitioner via telemedicine. I understand that the telemedicine visit will involve communicating with the Practitioner through live audiovisual communication technology and the disclosure of certain medical information by electronic transmission. I acknowledge that I have been given the opportunity to request an in-person assessment or other available alternative prior to the telemedicine visit and am voluntarily participating in the telemedicine visit.  I understand that I have the right to withhold or withdraw my consent to the use of telemedicine in the course of my care at any time, without affecting my right to future care or treatment, and that the  Practitioner or I may terminate the telemedicine visit at any time. I understand that I have the right to inspect all information obtained and/or recorded in the course of the telemedicine visit and may receive copies of available information for a reasonable fee.  I understand that some of the potential risks of receiving the Services via telemedicine include:   Delay or interruption in medical evaluation due to technological equipment failure or disruption;  Information transmitted may not be sufficient (e.g. poor resolution of images) to allow for appropriate medical decision making by the Practitioner; and/or   In rare instances, security protocols could fail, causing a breach of personal health information.  Furthermore, I acknowledge that it is my responsibility to provide information about my medical history, conditions and care that is complete and accurate to the best of my ability. I acknowledge that Practitioner's advice, recommendations, and/or decision may be based on factors not within their control, such as incomplete or inaccurate data provided by me or distortions of diagnostic images or specimens that may result from electronic transmissions. I understand that the practice of  medicine is not an Chief Strategy Officer and that Practitioner makes no warranties or guarantees regarding treatment outcomes. I acknowledge that I will receive a copy of this consent concurrently upon execution via email to the email address I last provided but may also request a printed copy by calling the office of [place].    I understand that my insurance will be billed for this visit.   I have read or had this consent read to me.  I understand the contents of this consent, which adequately explains the benefits and risks of the Services being provided via telemedicine.   I have been provided ample opportunity to ask questions regarding this consent and the Services and have had my questions answered to my  satisfaction.  I give my informed consent for the services to be provided through the use of telemedicine in my medical care  By participating in this telemedicine visit I agree to the above.

## 2018-11-07 ENCOUNTER — Other Ambulatory Visit: Payer: Self-pay

## 2018-11-07 DIAGNOSIS — U071 COVID-19: Secondary | ICD-10-CM

## 2018-11-08 LAB — NOVEL CORONAVIRUS, NAA: SARS-CoV-2, NAA: NOT DETECTED

## 2018-11-17 ENCOUNTER — Encounter: Payer: Self-pay | Admitting: Cardiology

## 2018-11-17 ENCOUNTER — Telehealth (INDEPENDENT_AMBULATORY_CARE_PROVIDER_SITE_OTHER): Payer: Medicare Other | Admitting: Cardiology

## 2018-11-17 DIAGNOSIS — Z8679 Personal history of other diseases of the circulatory system: Secondary | ICD-10-CM

## 2018-11-17 DIAGNOSIS — I471 Supraventricular tachycardia: Secondary | ICD-10-CM

## 2018-11-17 NOTE — Progress Notes (Signed)
Virtual Visit via Telephone Note   This visit type was conducted due to national recommendations for restrictions regarding the COVID-19 Pandemic (e.g. social distancing) in an effort to limit this patient's exposure and mitigate transmission in our community.  Due to her co-morbid illnesses, this patient is at least at moderate risk for complications without adequate follow up.  This format is felt to be most appropriate for this patient at this time.  The patient did not have access to video technology/had technical difficulties with video requiring transitioning to audio format only (telephone).  All issues noted in this document were discussed and addressed.  No physical exam could be performed with this format.  Please refer to the patient's chart for her  consent to telehealth for [facility].   Date:  11/17/2018   ID:  Olivia Mccormick, DOB [birth year], MRN [id number]  Patient Location: Home Provider Location: Office  PCP:  Ignatius Specking, MD  Cardiologist:  Nona Dell, MD Electrophysiologist:  None   Evaluation Performed:  Follow-Up Visit  Chief Complaint:   Cardiac follow-up  History of Present Illness:    Olivia Mccormick is a 85 y.o. female last assessed via telehealth encounter in June.  We spoke by phone today.  I talked with her daughter Olegario Messier as well.  She still resides at [facility].  She has had no interval hospitalizations, no syncope or palpitations.  She did get a COVID-19 test last month which was negative.  I reviewed her medications, we stopped Cardizem completely at the last visit in light of previously documented heart block.   Past Medical History:  Diagnosis Date  . Allergic rhinitis   . Atrial tachycardia (HCC)   . Carotid artery disease (HCC)    1-50% bilateral ICA stenosis 2011 [facility]  . Chronic respiratory failure (HCC)   . COPD (chronic obstructive pulmonary disease) (HCC)    3L home O2  . History of glaucoma   . Macular  degeneration   . Mild aortic insufficiency   . Mild mitral regurgitation   . Mild tricuspid regurgitation   . Palpitations   . Premature atrial contractions   . Pulmonary hypertension (HCC)   . PVC's (premature ventricular contractions)    Past Surgical History:  Procedure Laterality Date  . APPENDECTOMY    . CATARACT EXTRACTION Bilateral      Current Meds  Medication Sig  . albuterol (PROAIR HFA) 108 (90 Base) MCG/ACT inhaler Inhale 2 puffs into the lungs 4 (four) times daily.  Marland Kitchen ALPRAZolam (XANAX) 0.25 MG tablet Take 0.25 mg by mouth 2 (two) times daily.   Marland Kitchen aspirin 81 MG tablet Take 81 mg by mouth at bedtime.   . brimonidine (ALPHAGAN P) 0.1 % SOLN Place 1 drop into both eyes 2 (two) times daily.  . Calcium-Vitamin D 600-125 MG-UNIT TABS Take 1 tablet by mouth daily.   . Cholecalciferol (VITAMIN D3) 1000 UNITS CAPS Take by mouth daily.    . dorzolamide (TRUSOPT) 2 % ophthalmic solution Place 1 drop into both eyes 2 (two) times daily.   Marland Kitchen escitalopram (LEXAPRO) 5 MG tablet Take 5 mg by mouth daily.   . Fluticasone-Umeclidin-Vilant (TRELEGY ELLIPTA) 100-62.5-25 MCG/INH AEPB Inhale 1 puff into the lungs daily.  Marland Kitchen ibuprofen (ADVIL,MOTRIN) 200 MG tablet Take 400 mg by mouth 2 (two) times daily.      Allergies:   Prednisone   Social History   Tobacco Use  . Smoking status: Never Smoker  . Smokeless tobacco: Never  Used  Substance Use Topics  . Alcohol use: No  . Drug use: No     Family Hx: The patient's family history includes Alzheimer's disease in her brother; Cancer in her sister; Heart attack (age of onset: 4490) in her sister; Heart failure in her father, mother, and sister; Stroke in her sister.  ROS:   Please see the history of present illness.    Chronic hearing loss and shortness of breath. All other systems reviewed and are negative.   Prior CV studies:   The following studies were reviewed today:  Lexiscan Myoview 06/18/2016:  No diagnostic ST segment  changes to indicate ischemia.  Small, mild intensity, mid to apical inferior defect that is fixed and more prominent on rest imaging suggesting attenuation artifact. No large ischemic territories noted.  This is a low risk study.  Nuclear stress EF: 82%  Echocardiogram 06/12/2015: Study Conclusions  - Left ventricle: The cavity size was normal. There was focal basal hypertrophy. Systolic function was normal. The estimated ejection fraction was in the range of 60% to 65%. Wall motion was normal; there were no regional wall motion abnormalities. The study is not technically sufficient to allow evaluation of LV diastolic function. - Aortic valve: Mildly to moderately calcified annulus. Trileaflet; normal thickness leaflets. There was mild to moderate regurgitation. Valve area (VTI): 1.93 cm^2. Valve area (Vmax): 1.9 cm^2. Valve area (Vmean): 2.04 cm^2. Regurgitation pressure half-time: 514 ms. - Mitral valve: Mildly calcified annulus. Mildly thickened leaflets . There was mild to moderate regurgitation. The MR vena contracta is 0.4cm. - Right ventricle: The cavity size was mildly dilated. - Right atrium: The atrium was mildly dilated. - Tricuspid valve: There was mild-moderate regurgitation. - Pulmonary arteries: Systolic pressure was moderately increased. PA peak pressure: 66 mm Hg (S).  Labs/Other Tests and Data Reviewed:    EKG:  An ECG dated 07/04/2018 was personally reviewed today and demonstrated:  Sinus rhythm with complete heart block, likely ventricular escape with left bundle branch block and PVC.  Recent Labs:  May 2020: AST 44, ALT 34, BUN 27, creatinine 0.49, hemoglobin 12.1, platelets 257, troponin T peak of 0.03  Wt Readings from Last 3 Encounters:  11/17/18 98 lb (44.5 kg)  12/24/17 94 lb (42.6 kg)  04/09/17 101 lb 9.6 oz (46.1 kg)     Objective:    Vital Signs:  BP (!) 148/79   Pulse 76   Ht 4\' 8"  (1.422 m)   Wt 98 lb (44.5 kg)    LMP  (LMP Unknown)   SpO2 99%   BMI 21.97 kg/m    Patient spoke in short sentences, not short of breath. No audible wheezing or coughing.  ASSESSMENT & PLAN:    1.  History of complete heart block, refer to prior note for discussion.  Pacemaker was not pursued [facility].  She is now off Cardizem and asymptomatic with heart rate in the 70s.  Continue with observation at this point.  2.  History of atrial tachycardia.  No reported palpitations.  She was taken off Cardizem as outlined above.  3.  Severe, oxygen dependent COPD with chronic hypoxic/hypercarbic respiratory failure.  Keep follow-up with PCP.  COVID-19 Education: The signs and symptoms of COVID-19 were discussed with the patient and how to seek care for testing (follow up with PCP or arrange E-visit).  The importance of social distancing was discussed today.  Time:   Today, I have spent 6 minutes with the patient  with telehealth technology discussing the above problems.     Medication Adjustments/Labs and Tests Ordered: Current medicines are reviewed at length with the patient today.  Concerns regarding medicines are outlined above.   Tests Ordered: No orders of the defined types were placed in this encounter.   Medication Changes: No orders of the defined types were placed in this encounter.   Follow Up:  Virtual Visit 6 months.  Signed, Rozann Lesches, MD  11/17/2018 10:48 AM    [place]

## 2018-11-17 NOTE — Patient Instructions (Addendum)
Medication Instructions:   Your physician recommends that you continue on your current medications as directed. Please refer to the Current Medication list given to you today.  Labwork:  NONE  Testing/Procedures:  NONE  Follow-Up:  Your physician recommends that you schedule a follow-up appointment in: 6 month for a virtual visit. You will receive a reminder letter in the mail in about 4 months reminding you to call and schedule your appointment. If you don't receive this letter, please contact our office.  Any Other Special Instructions Will Be Listed Below (If Applicable).  If you need a refill on your cardiac medications before your next appointment, please call your pharmacy.

## 2018-12-20 ENCOUNTER — Ambulatory Visit: Payer: Medicare Other | Admitting: Cardiology

## 2019-02-28 DIAGNOSIS — I712 Thoracic aortic aneurysm, without rupture: Secondary | ICD-10-CM | POA: Diagnosis not present

## 2019-02-28 DIAGNOSIS — Z6820 Body mass index (BMI) 20.0-20.9, adult: Secondary | ICD-10-CM | POA: Diagnosis not present

## 2019-02-28 DIAGNOSIS — J449 Chronic obstructive pulmonary disease, unspecified: Secondary | ICD-10-CM | POA: Diagnosis not present

## 2019-02-28 DIAGNOSIS — Z299 Encounter for prophylactic measures, unspecified: Secondary | ICD-10-CM | POA: Diagnosis not present

## 2019-02-28 DIAGNOSIS — I5030 Unspecified diastolic (congestive) heart failure: Secondary | ICD-10-CM | POA: Diagnosis not present

## 2019-02-28 DIAGNOSIS — R609 Edema, unspecified: Secondary | ICD-10-CM | POA: Diagnosis not present

## 2019-03-13 DIAGNOSIS — I712 Thoracic aortic aneurysm, without rupture: Secondary | ICD-10-CM | POA: Diagnosis not present

## 2019-03-13 DIAGNOSIS — J449 Chronic obstructive pulmonary disease, unspecified: Secondary | ICD-10-CM | POA: Diagnosis not present

## 2019-03-13 DIAGNOSIS — R609 Edema, unspecified: Secondary | ICD-10-CM | POA: Diagnosis not present

## 2019-03-13 DIAGNOSIS — Z299 Encounter for prophylactic measures, unspecified: Secondary | ICD-10-CM | POA: Diagnosis not present

## 2019-03-13 DIAGNOSIS — I5030 Unspecified diastolic (congestive) heart failure: Secondary | ICD-10-CM | POA: Diagnosis not present

## 2019-04-04 DIAGNOSIS — Z299 Encounter for prophylactic measures, unspecified: Secondary | ICD-10-CM | POA: Diagnosis not present

## 2019-04-04 DIAGNOSIS — I5030 Unspecified diastolic (congestive) heart failure: Secondary | ICD-10-CM | POA: Diagnosis not present

## 2019-04-04 DIAGNOSIS — J9611 Chronic respiratory failure with hypoxia: Secondary | ICD-10-CM | POA: Diagnosis not present

## 2019-04-04 DIAGNOSIS — R609 Edema, unspecified: Secondary | ICD-10-CM | POA: Diagnosis not present

## 2019-04-04 DIAGNOSIS — Z6820 Body mass index (BMI) 20.0-20.9, adult: Secondary | ICD-10-CM | POA: Diagnosis not present

## 2019-04-04 DIAGNOSIS — J449 Chronic obstructive pulmonary disease, unspecified: Secondary | ICD-10-CM | POA: Diagnosis not present

## 2019-04-11 DIAGNOSIS — R404 Transient alteration of awareness: Secondary | ICD-10-CM | POA: Diagnosis not present

## 2019-04-11 DIAGNOSIS — Z743 Need for continuous supervision: Secondary | ICD-10-CM | POA: Diagnosis not present

## 2019-05-10 DIAGNOSIS — J157 Pneumonia due to Mycoplasma pneumoniae: Secondary | ICD-10-CM | POA: Diagnosis not present

## 2019-05-10 DIAGNOSIS — J449 Chronic obstructive pulmonary disease, unspecified: Secondary | ICD-10-CM | POA: Diagnosis not present

## 2019-05-11 DIAGNOSIS — 419620001 Death: Secondary | SNOMED CT | POA: Diagnosis not present

## 2019-05-11 DEATH — deceased
